# Patient Record
Sex: Male | Born: 1950 | Race: White | Hispanic: No | Marital: Married | Smoking: Former smoker
Health system: Southern US, Community
[De-identification: ages and names within clinical notes are randomized; demographics above are authoritative.]

## PROBLEM LIST (undated history)

## (undated) DIAGNOSIS — E785 Hyperlipidemia, unspecified: Secondary | ICD-10-CM

## (undated) DIAGNOSIS — H269 Unspecified cataract: Secondary | ICD-10-CM

## (undated) DIAGNOSIS — D649 Anemia, unspecified: Secondary | ICD-10-CM

## (undated) DIAGNOSIS — G709 Myoneural disorder, unspecified: Secondary | ICD-10-CM

## (undated) DIAGNOSIS — M1990 Unspecified osteoarthritis, unspecified site: Secondary | ICD-10-CM

## (undated) DIAGNOSIS — N189 Chronic kidney disease, unspecified: Secondary | ICD-10-CM

## (undated) DIAGNOSIS — K219 Gastro-esophageal reflux disease without esophagitis: Secondary | ICD-10-CM

## (undated) HISTORY — DX: Anemia, unspecified: D64.9

## (undated) HISTORY — PX: SPINE SURGERY: SHX786

## (undated) HISTORY — DX: Myoneural disorder, unspecified: G70.9

## (undated) HISTORY — DX: Gastro-esophageal reflux disease without esophagitis: K21.9

## (undated) HISTORY — PX: KIDNEY STONE SURGERY: SHX686

## (undated) HISTORY — DX: Hyperlipidemia, unspecified: E78.5

## (undated) HISTORY — DX: Unspecified osteoarthritis, unspecified site: M19.90

## (undated) HISTORY — DX: Unspecified cataract: H26.9

## (undated) HISTORY — PX: FRACTURE SURGERY: SHX138

## (undated) HISTORY — DX: Chronic kidney disease, unspecified: N18.9

---

## 1995-08-14 HISTORY — PX: BACK SURGERY: SHX140

## 2004-10-19 ENCOUNTER — Ambulatory Visit: Payer: Self-pay | Admitting: Internal Medicine

## 2004-11-01 ENCOUNTER — Ambulatory Visit: Payer: Self-pay | Admitting: Gastroenterology

## 2004-11-06 ENCOUNTER — Ambulatory Visit: Payer: Self-pay | Admitting: Internal Medicine

## 2004-11-13 ENCOUNTER — Ambulatory Visit: Payer: Self-pay | Admitting: Gastroenterology

## 2004-11-15 ENCOUNTER — Ambulatory Visit: Payer: Self-pay

## 2005-01-24 ENCOUNTER — Ambulatory Visit: Payer: Self-pay | Admitting: Internal Medicine

## 2005-03-16 ENCOUNTER — Ambulatory Visit: Payer: Self-pay | Admitting: Internal Medicine

## 2005-09-07 ENCOUNTER — Ambulatory Visit: Payer: Self-pay | Admitting: Internal Medicine

## 2005-10-12 ENCOUNTER — Ambulatory Visit: Payer: Self-pay | Admitting: Internal Medicine

## 2005-10-22 ENCOUNTER — Ambulatory Visit: Payer: Self-pay | Admitting: Internal Medicine

## 2008-05-24 ENCOUNTER — Ambulatory Visit: Payer: Self-pay | Admitting: Family Medicine

## 2008-05-24 DIAGNOSIS — E785 Hyperlipidemia, unspecified: Secondary | ICD-10-CM

## 2008-05-24 DIAGNOSIS — R3129 Other microscopic hematuria: Secondary | ICD-10-CM | POA: Insufficient documentation

## 2008-05-24 DIAGNOSIS — R3915 Urgency of urination: Secondary | ICD-10-CM | POA: Insufficient documentation

## 2008-05-24 LAB — CONVERTED CEMR LAB
Glucose, Urine, Semiquant: NEGATIVE
Nitrite: NEGATIVE
Protein, U semiquant: NEGATIVE
Specific Gravity, Urine: 1.015
Urobilinogen, UA: 0.2
WBC Urine, dipstick: NEGATIVE

## 2008-05-25 ENCOUNTER — Encounter: Payer: Self-pay | Admitting: Family Medicine

## 2008-05-25 LAB — CONVERTED CEMR LAB
Albumin: 4.9 g/dL (ref 3.5–5.2)
BUN: 13 mg/dL (ref 6–23)
CO2: 19 meq/L (ref 19–32)
Glucose, Bld: 98 mg/dL (ref 70–99)
PSA: 1.83 ng/mL (ref 0.10–4.00)
Sodium: 140 meq/L (ref 135–145)
Total Bilirubin: 0.3 mg/dL (ref 0.3–1.2)
Total Protein: 7.7 g/dL (ref 6.0–8.3)
WBC, UA: NONE SEEN cells/hpf (ref <3)

## 2008-05-28 ENCOUNTER — Telehealth: Payer: Self-pay | Admitting: Family Medicine

## 2009-10-27 ENCOUNTER — Encounter (INDEPENDENT_AMBULATORY_CARE_PROVIDER_SITE_OTHER): Payer: Self-pay | Admitting: *Deleted

## 2010-06-22 ENCOUNTER — Ambulatory Visit: Payer: Self-pay | Admitting: Family Medicine

## 2010-06-22 ENCOUNTER — Telehealth: Payer: Self-pay | Admitting: Family Medicine

## 2010-06-22 DIAGNOSIS — M5430 Sciatica, unspecified side: Secondary | ICD-10-CM

## 2010-06-27 ENCOUNTER — Encounter: Admission: RE | Admit: 2010-06-27 | Discharge: 2010-06-27 | Payer: Self-pay | Admitting: Family Medicine

## 2010-06-27 ENCOUNTER — Ambulatory Visit: Payer: Self-pay | Admitting: Family Medicine

## 2010-06-29 ENCOUNTER — Telehealth: Payer: Self-pay | Admitting: Family Medicine

## 2010-06-29 ENCOUNTER — Encounter (INDEPENDENT_AMBULATORY_CARE_PROVIDER_SITE_OTHER): Payer: Self-pay | Admitting: *Deleted

## 2010-07-01 ENCOUNTER — Encounter: Admission: RE | Admit: 2010-07-01 | Discharge: 2010-07-01 | Payer: Self-pay | Admitting: Family Medicine

## 2010-07-03 ENCOUNTER — Encounter
Admission: RE | Admit: 2010-07-03 | Discharge: 2010-08-02 | Payer: Self-pay | Source: Home / Self Care | Attending: Family Medicine | Admitting: Family Medicine

## 2010-07-04 ENCOUNTER — Encounter (INDEPENDENT_AMBULATORY_CARE_PROVIDER_SITE_OTHER): Payer: Self-pay | Admitting: *Deleted

## 2010-07-04 ENCOUNTER — Encounter: Payer: Self-pay | Admitting: Family Medicine

## 2010-07-13 ENCOUNTER — Encounter: Payer: Self-pay | Admitting: Family Medicine

## 2010-07-26 ENCOUNTER — Encounter: Payer: Self-pay | Admitting: Family Medicine

## 2010-09-12 NOTE — Letter (Signed)
Summary: Out of Work  [facility]  [address]   [place], [place] [address]   Phone: [phone]  Fax: [phone]    June 29, 2010   Employee:  DOCTOR SHEAHAN    To Whom It May Concern:   For Medical reasons, please excuse the above named employee from work for the following dates:  Start:    End:    If you need additional information, please feel free to contact our office.         Sincerely,    Nani Gasser, MD

## 2010-09-12 NOTE — Letter (Signed)
Summary: Out of Work  [facility]  [address], [address]   [place], [place] [address]   Phone: [phone]  Fax: [phone]    June 27, 2010   Employee:  NIXXON FARIA    To Whom It May Concern:   For Medical reasons, please excuse the above named employee from work for the following dates:  Start:   06-26-2010  . Seen today in the office and he is minimially better. We are getting an xray and treating with a burst of steroids. Next step will be determined by the xray.   If you need additional information, please feel free to contact our office.         Sincerely,    Nani Gasser MD

## 2010-09-12 NOTE — Assessment & Plan Note (Signed)
Summary: Sciatica   Vital Signs:  Patient profile:   60 year old male Height:      70 inches Weight:      170 pounds Pulse rate:   78 / minute BP sitting:   138 / 81  (right arm) Cuff size:   regular  Vitals Entered By: Avon Gully CMA, Duncan Dull) (June 22, 2010 10:41 AM) CC: left hip pain since sunday, pain radiates down to leg, leg has a tingling feeling and numb   Primary Care Provider:  Nani Gasser MD  CC:  left hip pain since sunday, pain radiates down to leg, and leg has a tingling feeling and numb.  History of Present Illness: left hip pain since sunday, pain radiates down to leg, leg has a tingling feeling and numb. 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 Slept with pilllow b/t his legs.  Injry was Monday or Tuesday night. Yesterday leg was very sore all the way down. to his foot. Tried walking in hopes it would "loosen up".  Got worse and finally went home early.  Last night took Tyelnol and Ibup. Couldn't get comfortable. Had happened years ago after jumping.  But was better after 3-4 days, years ago.  Hx of low back surgeyr about 25 years ago. Bulging disc around L5.  Pain starts where his wallet is and pain shots down to his ankle. Has numbness on the lateral ankle. he denies any back painwith this episode.  of note he has lost 19 pounds since I last saw him but he says he has been working on this and try to exercise regularly.  Current Medications (verified): 1)  Ranitidine Hcl 75 Mg Tabs (Ranitidine Hcl) .... Take One Tablet By Mouth Twice A Day  Allergies (verified): No Known Drug Allergies  Comments:  Nurse/Medical Assistant: The patient's medications and allergies were reviewed with the patient and were updated in the Medication and Allergy Lists. Avon Gully CMA, Duncan Dull) (June 22, 2010 10:42 AM)  Past History:  Past Surgical History: Kidney stones 1993 Back surgery, lumbar disc fusion?  1997  Physical Exam  General:   Well-developed,well-nourished,in no acute distress; alert,appropriate and cooperative throughout examination Msk:  Normal flexion. Acutally says this feels better. No extention. Normal side bending and rotation right and left. Nontender lumbar spine or SI joint.  VEry tender over the mid left buttock. Neg straight leg raise.  Pain with hip adduction and abduction with knee crosssed. hip knee and ankle strength 5/5.   Impression & Recommendations:  Problem # 1:  SCIATICA (ICD-724.3) Assessment New I suspect that he has piriformis his syndrome.  To consider this may still be coming from his back.  He did have a negative straight leg raise. in the meantime recommend ibuprofen t.i.d. with food.  Stop immediately if any stomach irritation.  Can also try a muscle relaxer.  He has found these helpful for his back in the past.  Also can use hydrocodone at that time only.  Warned about potential sedation effects.  If he is not improving significantly in two weeks and needs return to the office for further evaluation.  Also gave him a handout on exercises to do for performance syndrome.  If this is not helping consider referral for formal PT. His updated medication list for this problem includes:    Cyclobenzaprine Hcl 10 Mg Tabs (Cyclobenzaprine hcl) .Marland Kitchen... Take 1 tablet by mouth three times a day as needed muscle spasm.    Hydrocodone-acetaminophen 5-500 Mg Tabs (Hydrocodone-acetaminophen) .Marland KitchenMarland KitchenMarland KitchenMarland Kitchen  1-2 tabs by mouth at bedtime as needed severe pain.  Complete Medication List: 1)  Ranitidine Hcl 75 Mg Tabs (Ranitidine hcl) .... Take one tablet by mouth twice a day 2)  Cyclobenzaprine Hcl 10 Mg Tabs (Cyclobenzaprine hcl) .... Take 1 tablet by mouth three times a day as needed muscle spasm. 3)  Hydrocodone-acetaminophen 5-500 Mg Tabs (Hydrocodone-acetaminophen) .Marland Kitchen.. 1-2 tabs by mouth at bedtime as needed severe pain.  Patient Instructions: 1)  IBuprofen 600mg  three times a day with foods and water. Stop if  bothers your stomach. 2)  Flexeril as needed for muscle relaxer. Can cause sedationl 3)  Can use the hydrocodone at bedtime.  4)  Please schedule a follow-up appointment in 2 weeks if not completely resolved.  5)  Can start exercises tomorrow. .  Prescriptions: HYDROCODONE-ACETAMINOPHEN 5-500 MG TABS (HYDROCODONE-ACETAMINOPHEN) 1-2 tabs by mouth at bedtime as needed severe pain.  #20 x 0   Entered and Authorized by:   Nani Gasser MD   Signed by:   Nani Gasser MD on 06/22/2010   Method used:   Printed then faxed to ...       [facility]  [address] [phone]* (retail)       [address] [place], [place]  [address]       Ph: [phone] or [phone]       Fax: [phone]   RxID:   [id number] CYCLOBENZAPRINE HCL 10 MG TABS (CYCLOBENZAPRINE HCL) Take 1 tablet by mouth three times a day as needed muscle spasm.  #30 x 0   Entered and Authorized by:   Nani Gasser MD   Signed by:   Nani Gasser MD on 06/22/2010   Method used:   Printed then faxed to ...       [facility]  [address] [phone]* (retail)       [address] [place], [place]  [address]       Ph: [phone] or [phone]       Fax: [phone]   RxID:   [id number]    Orders Added: 1)  Est. Patient Level IV [42706]

## 2010-09-12 NOTE — Consult Note (Signed)
Summary: [facility]   Imported By: Maryln Gottron 07/21/2010 14:32:13  _____________________________________________________________________  External Attachment:    Type:   Image     Comment:   External Document

## 2010-09-12 NOTE — Progress Notes (Signed)
Summary: Dr. note for pt seen today  Phone Note Call from Patient   Caller: Patient Summary of Call: Pt. was seen today and forgot to get his Dr. Phoebe Sharps from [place], and will come by later today to pick up.... Initial call taken by: Michaelle Copas,  June 22, 2010 12:06 PM  Follow-up for Phone Call        ok pt given a note for the 9th thru 11th Follow-up by: Avon Gully CMA, Duncan Dull),  June 22, 2010 4:49 PM

## 2010-09-12 NOTE — Letter (Signed)
Summary: Colonoscopy Letter  [facility]  [address] [place], [place] [address]   Phone: [phone]  Fax: [phone]      October 27, 2009 MRN: [id number]   TAISHAUN LEVELS [address] [place], [place]  [address]   Dear Mr. MONESTIME,   According to your medical record, it is time for you to schedule a Colonoscopy. The American Cancer Society recommends this procedure as a method to detect early colon cancer. Patients with a family history of colon cancer, or a personal history of colon polyps or inflammatory bowel disease are at increased risk.  This letter has been generated based on the recommendations made at the time of your procedure. If you feel that in your particular situation this may no longer apply, please contact our office.  Please call our office at [phone] to schedule this appointment or to update your records at your earliest convenience.  Thank you for cooperating with [place] to provide you with the very best care possible.   Sincerely,  Barbette Hair. Arlyce Dice, M.D.  [facility] [phone]

## 2010-09-12 NOTE — Assessment & Plan Note (Signed)
Summary: F/U sciatica   Vital Signs:  Patient profile:   60 year old male Height:      70 inches Weight:      172 pounds Pulse rate:   86 / minute BP sitting:   112 / 71  (right arm) Cuff size:   regular  Vitals Entered By: Avon Gully CMA, Duncan Dull) (June 27, 2010 9:45 AM) CC: f/u leg pain, hip feels better but left lower leg still hurts   Primary Care Manolito Jurewicz:  Nani Gasser MD  CC:  f/u leg pain and hip feels better but left lower leg still hurts.  History of Present Illness: f/u leg pain, hip feels better but left lower leg still hurts.  Feels he is 25-30% better. INtially felt some better but then suddently worse at the end of last week.   If uses his IBU adn muscle relaxer gets about 3 hours of rest. Still getting up at night because of pain. Can walk for an extended amout of time. Pain still in the left buttock and some pain an dnumnbness on teh left knee to the ankle on the outside.    Current Medications (verified): 1)  Ranitidine Hcl 75 Mg Tabs (Ranitidine Hcl) .... Take One Tablet By Mouth Twice A Day 2)  Cyclobenzaprine Hcl 10 Mg Tabs (Cyclobenzaprine Hcl) .... Take 1 Tablet By Mouth Three Times A Day As Needed Muscle Spasm. 3)  Hydrocodone-Acetaminophen 5-500 Mg Tabs (Hydrocodone-Acetaminophen) .Marland Kitchen.. 1-2 Tabs By Mouth At Bedtime As Needed Severe Pain.  Allergies (verified): No Known Drug Allergies  Comments:  Nurse/Medical Assistant: The patient's medications and allergies were reviewed with the patient and were updated in the Medication and Allergy Lists. Avon Gully CMA, Duncan Dull) (June 27, 2010 9:46 AM)  Physical Exam  General:  Well-developed,well-nourished,in no acute distress; alert,appropriate and cooperative throughout examination Msk:  Low back flexion, rotatin adn side bending are normal and symmetric.  Dec extension. No lumbar back pain. Mild tenderness in the buttock area.  Neg straight leg raise. Hip, knee and ankle strength 5/5 .    Skin:  no rashes.     Impression & Recommendations:  Problem # 1:  SCIATICA (ICD-724.3) Will get xray. Depending on finding will likely refer for PT. He is out of work until he is able to improve secondary to his limited walking  ability.   His updated medication list for this problem includes:    Cyclobenzaprine Hcl 10 Mg Tabs (Cyclobenzaprine hcl) .Marland Kitchen... Take 1 tablet by mouth three times a day as needed muscle spasm.    Hydrocodone-acetaminophen 5-500 Mg Tabs (Hydrocodone-acetaminophen) .Marland Kitchen... 1-2 tabs by mouth at bedtime as needed severe pain.  Orders: T-DG Lumbar Spine 2-3 Views (72100)  Complete Medication List: 1)  Ranitidine Hcl 75 Mg Tabs (Ranitidine hcl) .... Take one tablet by mouth twice a day 2)  Cyclobenzaprine Hcl 10 Mg Tabs (Cyclobenzaprine hcl) .... Take 1 tablet by mouth three times a day as needed muscle spasm. 3)  Hydrocodone-acetaminophen 5-500 Mg Tabs (Hydrocodone-acetaminophen) .Marland Kitchen.. 1-2 tabs by mouth at bedtime as needed severe pain. 4)  Prednisone 20 Mg Tabs (Prednisone) .... 2 tabs by mouth daily for r one week.  Patient Instructions: 1)  We will call you with the xray results.  Prescriptions: PREDNISONE 20 MG TABS (PREDNISONE) 2 tabs by mouth daily for r one week.  #10 x 0   Entered and Authorized by:   Nani Gasser MD   Signed by:   Nani Gasser MD on 06/27/2010  Method used:   [facility] [phone][facility] (retail)       [address]       [place], [place]  [address]       Ph: [phone] or [phone]       Fax: [phone]   RxID:   [id number]    Orders Added: 1)  T-DG Lumbar Spine 2-3 Views [72100] 2)  Est. Patient Level III [36644]

## 2010-09-12 NOTE — Letter (Signed)
Summary: Primary Care Consult Scheduled Letter  [facility]  [address]   [place], [place] [address]   Phone: [phone]  Fax: [phone]      07/04/2010 MRN: [id number]  Dennis Waller [address] [place], [place]  [address]    Dear Mr. Dennis Waller,     We have scheduled an appointment for you.  At the recommendation of Dr.Metheney, we have scheduled you a consult with Dr.Duda on Thurday 07/13/10 at 8:00.  Their located in the same buiding as Dr. Linford Arnold but they are in [address]. The office phone number is [phone].  If this appointment day and time is not convenient for you, please feel free to call the office of the doctor you are being referred to at the number listed above and reschedule the appointment.     It is important for you to keep your scheduled appointments. We are here to make sure you are given good patient care.   Thank you, Michaelle Copas [phone] Patient Care Coordinator [facility] Kathryne Sharper

## 2010-09-12 NOTE — Progress Notes (Signed)
Summary: Return to work  Phone Note Call from Patient Call back at [employer] [phone]   Caller: Patient Call For: Nani Gasser MD Summary of Call: Pt calls and wants to know if he can a letter to return to work Initial call taken by: Kathlene November LPN,  June 29, 2010 2:19 PM  Follow-up for Phone Call        YES, OK to return to work.  Follow-up by: Nani Gasser MD,  June 29, 2010 2:31 PM  Additional Follow-up for Phone Call Additional follow up Details #1::        pt notified Additional Follow-up by: Avon Gully CMA, Duncan Dull),  June 29, 2010 4:00 PM

## 2010-09-12 NOTE — Miscellaneous (Signed)
Summary: Initial Summary for PT Services/[facility]  Initial Summary for PT Services/[facility]   Imported By: Maryln Gottron 07/17/2010 14:58:32  _____________________________________________________________________  External Attachment:    Type:   Image     Comment:   External Document

## 2010-09-14 NOTE — Miscellaneous (Signed)
Summary: PT Discharge/[facility]  PT Discharge/[facility]   Imported By: Lanelle Bal 08/21/2010 10:30:13  _____________________________________________________________________  External Attachment:    Type:   Image     Comment:   External Document

## 2011-02-28 ENCOUNTER — Telehealth: Payer: Self-pay | Admitting: *Deleted

## 2011-02-28 NOTE — Telephone Encounter (Signed)
Pt does not want to schedule colonoscopy, he stated he is aware he is past due but has family members in the hospital will call when he is ready....    I dont think by his tone he wants to be bothered with another reminder about his colonoscopy.

## 2011-03-08 ENCOUNTER — Encounter: Payer: Self-pay | Admitting: Family Medicine

## 2011-03-09 ENCOUNTER — Ambulatory Visit (INDEPENDENT_AMBULATORY_CARE_PROVIDER_SITE_OTHER): Payer: BC Managed Care – PPO | Admitting: Family Medicine

## 2011-03-09 ENCOUNTER — Encounter: Payer: Self-pay | Admitting: Family Medicine

## 2011-03-09 VITALS — BP 130/76 | HR 69 | Ht 70.0 in | Wt 174.0 lb

## 2011-03-09 DIAGNOSIS — Z23 Encounter for immunization: Secondary | ICD-10-CM

## 2011-03-09 DIAGNOSIS — Z2911 Encounter for prophylactic immunotherapy for respiratory syncytial virus (RSV): Secondary | ICD-10-CM

## 2011-03-09 DIAGNOSIS — E785 Hyperlipidemia, unspecified: Secondary | ICD-10-CM

## 2011-03-09 MED ORDER — ROSUVASTATIN CALCIUM 10 MG PO TABS: 10.0000 mg | ORAL_TABLET | Freq: Every day | ORAL | Status: DC

## 2011-03-09 MED ORDER — OMEGA-3 FATTY ACIDS 1000 MG PO CAPS: 4.0000 g | ORAL_CAPSULE | Freq: Every day | ORAL | Status: AC

## 2011-03-09 MED ORDER — TETANUS-DIPHTH-ACELL PERTUSSIS 5-2-15.5 LF-MCG/0.5 IM SUSP: 0.5000 mL | Freq: Once | INTRAMUSCULAR | Status: DC

## 2011-03-09 MED ORDER — ASPIRIN EC 81 MG PO TBEC: 81.0000 mg | DELAYED_RELEASE_TABLET | Freq: Every day | ORAL | Status: AC

## 2011-03-09 NOTE — Assessment & Plan Note (Addendum)
Reviewed his results. Recommend restart his Fish oil for his TG.  Keep up the exercise. Recommend restart a statin.  Recheck labs in 8 weeks once starts the statin.

## 2011-03-09 NOTE — Patient Instructions (Addendum)
Call in 2 months and we will recheck your lipids to see how well the cholesterol med is working Do restart your baby Aspirin and fish oil.

## 2011-03-09 NOTE — Progress Notes (Signed)
  Subjective:    Patient ID: Dennis Waller, male    DOB: [birth year], 60 y.o.   MRN: [id number]  HPI Hyperlipidemia - Used to be on vytorin about 5 years ago. When ran out never took it again. 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 Brought in copy of labs today to review.  He decided he want to get back on track.  Still takes ranitidine bid for reflux. Tolerates really well. He has lost a lot of weight intentionally.  He is very active and gets regular exercise. Uses tylenol 4 grams and  Advill 1200mg  for pain relief with his joint pain.  Did PT last year and that was helpful.  Still does his exercises prn. Quit taking his baby ASA.  Used to on Fish oil for his TG     Review of Systems     Objective:   Physical Exam  Constitutional: He is oriented to person, place, and time. He appears well-developed and well-nourished.  HENT:  Head: Normocephalic and atraumatic.  Cardiovascular: Normal rate and regular rhythm.   Pulmonary/Chest: Effort normal and breath sounds normal.  Musculoskeletal: He exhibits no edema.  Neurological: He is alert and oriented to person, place, and time.  Skin: Skin is warm and dry.  Psychiatric: He has a normal mood and affect. His behavior is normal.          Assessment & Plan:   I do recommend restarting his baby ASA as well.   Dsicussed need for vcaccination for Tdap and Shingles.  He agreed to get those today.

## 2011-03-12 ENCOUNTER — Telehealth: Payer: Self-pay | Admitting: *Deleted

## 2011-03-12 DIAGNOSIS — Z1211 Encounter for screening for malignant neoplasm of colon: Secondary | ICD-10-CM

## 2011-03-12 NOTE — Telephone Encounter (Signed)
Pt's wife called and states he needs a referral to GI because he needs a colonoscopy. Trying to get this scheduled late sept

## 2011-03-12 NOTE — Telephone Encounter (Signed)
Order entered

## 2011-03-19 ENCOUNTER — Encounter: Payer: Self-pay | Admitting: Family Medicine

## 2011-04-23 LAB — HM COLONOSCOPY

## 2011-05-07 ENCOUNTER — Other Ambulatory Visit: Payer: Self-pay | Admitting: Family Medicine

## 2011-05-09 ENCOUNTER — Encounter: Payer: Self-pay | Admitting: Family Medicine

## 2011-07-05 ENCOUNTER — Other Ambulatory Visit: Payer: Self-pay | Admitting: Family Medicine

## 2011-09-13 ENCOUNTER — Other Ambulatory Visit: Payer: Self-pay | Admitting: Family Medicine

## 2012-02-18 ENCOUNTER — Ambulatory Visit (INDEPENDENT_AMBULATORY_CARE_PROVIDER_SITE_OTHER): Payer: Managed Care, Other (non HMO) | Admitting: Family Medicine

## 2012-02-18 ENCOUNTER — Encounter: Payer: Self-pay | Admitting: Family Medicine

## 2012-02-18 VITALS — BP 119/79 | HR 69 | Ht 70.0 in | Wt 182.0 lb

## 2012-02-18 DIAGNOSIS — R252 Cramp and spasm: Secondary | ICD-10-CM

## 2012-02-18 DIAGNOSIS — E785 Hyperlipidemia, unspecified: Secondary | ICD-10-CM

## 2012-02-18 DIAGNOSIS — K219 Gastro-esophageal reflux disease without esophagitis: Secondary | ICD-10-CM

## 2012-02-18 DIAGNOSIS — H269 Unspecified cataract: Secondary | ICD-10-CM

## 2012-02-18 DIAGNOSIS — M1990 Unspecified osteoarthritis, unspecified site: Secondary | ICD-10-CM

## 2012-02-18 NOTE — Patient Instructions (Addendum)
Work on staying hydrated and do gentle stretches for leg cramps.

## 2012-02-18 NOTE — Progress Notes (Signed)
  Subjective:    Patient ID: Dennis Waller, male    DOB: 1950-11-27, 61 y.o.   MRN: [id number]  HPI Hyperlipidemia  - doing well. Ran out of med about 1.5 months ago. Says he feels well.  No CP or SOB.   OA- Says going once a month to get a massage and says this has really helped.    Saw optometrist recently and got new lenses, but has starting to develop a cataract in the right eye.  Also dx with dry eye and using OTC artificial tears and really helps.     Review of Systems     Objective:   Physical Exam  Constitutional: He is oriented to person, place, and time. He appears well-developed and well-nourished.  HENT:  Head: Normocephalic and atraumatic.  Neck: Neck supple. No thyromegaly present.  Cardiovascular: Normal rate, regular rhythm and normal heart sounds.        No carotid bruits.   Pulmonary/Chest: Effort normal and breath sounds normal.  Lymphadenopathy:    He has no cervical adenopathy.  Neurological: He is alert and oriented to person, place, and time.  Skin: Skin is warm and dry.  Psychiatric: He has a normal mood and affect. His behavior is normal.          Assessment & Plan:  Hyperlipidemia - Work on Engineer, building services exercise. Restart med and recheck chol levels in 2-4 weeks. Will go for labs today.    OA- Can use tyelnol prn. Continue massage prn. Will use Advil occ.   Foot cramps -= Says always happens at night.  Says better when hydrates really well and replace electrolytes. Work on International aid/development worker.

## 2012-02-19 ENCOUNTER — Other Ambulatory Visit: Payer: Self-pay | Admitting: Family Medicine

## 2012-03-06 LAB — COMPLETE METABOLIC PANEL WITH GFR
Albumin: 4.4 g/dL (ref 3.5–5.2)
CO2: 28 mEq/L (ref 19–32)
Calcium: 8.9 mg/dL (ref 8.4–10.5)
Chloride: 105 mEq/L (ref 96–112)
GFR, Est African American: >89 mL/min
GFR, Est Non African American: 88 mL/min
Glucose, Bld: 92 mg/dL (ref 70–99)
Sodium: 141 mEq/L (ref 135–145)
Total Bilirubin: 0.4 mg/dL (ref 0.3–1.2)
Total Protein: 6.5 g/dL (ref 6.0–8.3)

## 2012-03-06 LAB — LIPID PANEL
HDL: 36 mg/dL — ABNORMAL LOW (ref >39)
LDL Cholesterol: 82 mg/dL (ref 0–99)

## 2012-04-15 ENCOUNTER — Other Ambulatory Visit: Payer: Self-pay | Admitting: Family Medicine

## 2012-06-19 ENCOUNTER — Other Ambulatory Visit: Payer: Self-pay | Admitting: Family Medicine

## 2012-06-26 ENCOUNTER — Other Ambulatory Visit: Payer: Self-pay | Admitting: Family Medicine

## 2012-09-29 ENCOUNTER — Other Ambulatory Visit: Payer: Self-pay | Admitting: Family Medicine

## 2012-12-03 ENCOUNTER — Other Ambulatory Visit: Payer: Self-pay | Admitting: Family Medicine

## 2012-12-03 NOTE — Telephone Encounter (Signed)
Needs appoinment

## 2013-02-12 ENCOUNTER — Ambulatory Visit (INDEPENDENT_AMBULATORY_CARE_PROVIDER_SITE_OTHER): Payer: Managed Care, Other (non HMO) | Admitting: Family Medicine

## 2013-02-12 ENCOUNTER — Encounter: Payer: Self-pay | Admitting: Family Medicine

## 2013-02-12 VITALS — BP 102/80 | HR 75 | Ht 70.0 in | Wt 185.0 lb

## 2013-02-12 DIAGNOSIS — Z0000 Encounter for general adult medical examination without abnormal findings: Secondary | ICD-10-CM

## 2013-02-12 DIAGNOSIS — Z720 Tobacco use: Secondary | ICD-10-CM

## 2013-02-12 DIAGNOSIS — R200 Anesthesia of skin: Secondary | ICD-10-CM

## 2013-02-12 DIAGNOSIS — R209 Unspecified disturbances of skin sensation: Secondary | ICD-10-CM

## 2013-02-12 DIAGNOSIS — F17200 Nicotine dependence, unspecified, uncomplicated: Secondary | ICD-10-CM

## 2013-02-12 DIAGNOSIS — E785 Hyperlipidemia, unspecified: Secondary | ICD-10-CM

## 2013-02-12 LAB — COMPLETE METABOLIC PANEL WITH GFR
ALT: 13 U/L (ref 0–53)
Albumin: 4.6 g/dL (ref 3.5–5.2)
CO2: 27 mEq/L (ref 19–32)
Calcium: 9 mg/dL (ref 8.4–10.5)
Chloride: 105 mEq/L (ref 96–112)
GFR, Est African American: >89 mL/min
GFR, Est Non African American: >89 mL/min
Glucose, Bld: 86 mg/dL (ref 70–99)
Potassium: 4.1 mEq/L (ref 3.5–5.3)
Sodium: 142 mEq/L (ref 135–145)
Total Protein: 6.5 g/dL (ref 6.0–8.3)

## 2013-02-12 LAB — CBC WITH DIFFERENTIAL/PLATELET
Basophils Absolute: 0 10*3/uL (ref 0.0–0.1)
Basophils Relative: 0 % (ref 0–1)
Eosinophils Relative: 4 % (ref 0–5)
HCT: 35.4 % — ABNORMAL LOW (ref 39.0–52.0)
Lymphocytes Relative: 30 % (ref 12–46)
MCHC: 35 g/dL (ref 30.0–36.0)
Monocytes Absolute: 0.7 10*3/uL (ref 0.1–1.0)
Neutro Abs: 3.5 10*3/uL (ref 1.7–7.7)
Platelets: 267 10*3/uL (ref 150–400)
RDW: 13.5 % (ref 11.5–15.5)
WBC: 6.4 10*3/uL (ref 4.0–10.5)

## 2013-02-12 LAB — FERRITIN: Ferritin: 122 ng/mL (ref 22–322)

## 2013-02-12 LAB — VITAMIN B12: Vitamin B-12: 269 pg/mL (ref 211–911)

## 2013-02-12 LAB — HEMOGLOBIN A1C: Hgb A1c MFr Bld: 5.5 % (ref <5.7)

## 2013-02-12 LAB — LIPID PANEL: LDL Cholesterol: 72 mg/dL (ref 0–99)

## 2013-02-12 NOTE — Progress Notes (Signed)
Subjective:    Patient ID: Dennis Waller, male    DOB: [birth year], 62 y.o.   MRN: [id number]  HPI Here for CPE today. Plans on retiring in October. Says his job is really stressful and is very physically demandin.  Does have some tingling in his toes.    Review of Systems Comprehensive ROS neg  BP 140/80  Pulse 75  Ht 5\' 10"  (1.778 m)  Wt 185 lb (83.915 kg)  BMI 26.54 kg/m2    No Known Allergies  Past Medical History  Diagnosis Date  . Hyperlipidemia   . Chronic kidney disease     kidney stones    Past Surgical History  Procedure Laterality Date  . Kidney stone surgery    . Back surgery  1997    ? lumbar disc fusion    History   Social History  . Marital Status: Married    Spouse Name: diane    Number of Children: N/A  . Years of Education: N/A   Occupational History  .     Social History Main Topics  . Smoking status: Current Every Day Smoker -- 1.00 packs/day    Types: Cigarettes  . Smokeless tobacco: Not on file  . Alcohol Use: Yes  . Drug Use: No  . Sexually Active: Not on file   Other Topics Concern  . 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 Diabetes Mother   . Hypertension Mother   . Cancer Mother     colon  . COPD Mother   . Hyperlipidemia Father   . Hypertension Father     Outpatient Encounter Prescriptions as of 02/12/2013  Medication Sig Dispense Refill  . CRESTOR 10 MG tablet TAKE 1 TABLET BY MOUTH AT BEDTIME.  30 tablet  1  . ranitidine (ZANTAC) 75 MG tablet Take 75 mg by mouth 2 (two) times daily.        . [DISCONTINUED] CRESTOR 10 MG tablet TAKE 1 TABLET BY MOUTH AT BEDTIME.  30 tablet  0   No facility-administered encounter medications on file as of 02/12/2013.          Objective:   Physical Exam  Constitutional: He is oriented to person, place, and time. He appears well-developed and well-nourished.  HENT:  Head: Normocephalic and  atraumatic.  Right Ear: External ear normal.  Left Ear: External ear normal.  Nose: Nose normal.  Mouth/Throat: Oropharynx is clear and moist.  Eyes: Conjunctivae and EOM are normal. Pupils are equal, round, and reactive to light.  Neck: Normal range of motion. Neck supple. No thyromegaly present.  Cardiovascular: Normal rate, regular rhythm, normal heart sounds and intact distal pulses.   No carotid bruits.   Pulmonary/Chest: Effort normal and breath sounds normal.  Abdominal: Soft. Bowel sounds are normal. He exhibits no distension and no mass. There is no tenderness. There is no rebound and no guarding.  Musculoskeletal: Normal range of motion. He exhibits no edema.  Lymphadenopathy:    He has no cervical adenopathy.  Neurological: He is alert and oriented to person, place, and time. He has normal reflexes.  Skin: Skin is warm and dry.  Psychiatric: He has a normal mood and affect. His behavior is normal. Judgment and thought content normal.          Assessment & Plan:  CPE Keep up a regular exercise program and make sure you are  eating a healthy diet Try to eat 4 servings of dairy a day, or if you are lactose intolerant take a calcium with vitamin D daily.  Your vaccines are up to date.  BAseline EKG today.  EKG shows rate of 62 beats per minute, normal sinus rhythm with normal axis. No acute changes. Tob abuse - encoiuraged cessation. H.O given.  BP is slightly elevated today, unusual for him.

## 2013-02-12 NOTE — Patient Instructions (Signed)

## 2013-02-17 ENCOUNTER — Other Ambulatory Visit: Payer: Self-pay | Admitting: *Deleted

## 2013-02-17 ENCOUNTER — Encounter: Payer: Self-pay | Admitting: *Deleted

## 2013-02-17 MED ORDER — SILDENAFIL CITRATE 50 MG PO TABS: 50.0000 mg | ORAL_TABLET | Freq: Every day | ORAL | Status: DC | PRN

## 2013-02-17 MED ORDER — ROSUVASTATIN CALCIUM 10 MG PO TABS: ORAL_TABLET | ORAL | Status: DC

## 2013-02-17 NOTE — Progress Notes (Signed)
rx sent

## 2013-02-17 NOTE — Progress Notes (Signed)
Pt would like an Rx for Viagra. Stated that he has taken this in the past it was prescribed by another dr.. Please advise.Dennis Waller

## 2013-07-02 ENCOUNTER — Ambulatory Visit (INDEPENDENT_AMBULATORY_CARE_PROVIDER_SITE_OTHER): Payer: Managed Care, Other (non HMO) | Admitting: Family Medicine

## 2013-07-02 ENCOUNTER — Encounter: Payer: Self-pay | Admitting: Family Medicine

## 2013-07-02 VITALS — BP 139/78 | HR 73 | Wt 184.0 lb

## 2013-07-02 DIAGNOSIS — M5432 Sciatica, left side: Secondary | ICD-10-CM

## 2013-07-02 DIAGNOSIS — M5430 Sciatica, unspecified side: Secondary | ICD-10-CM

## 2013-07-02 DIAGNOSIS — Z23 Encounter for immunization: Secondary | ICD-10-CM

## 2013-07-02 MED ORDER — TRAMADOL HCL 50 MG PO TABS: 50.0000 mg | ORAL_TABLET | Freq: Three times a day (TID) | ORAL | Status: DC | PRN

## 2013-07-02 MED ORDER — PREDNISONE 50 MG PO TABS: 50.0000 mg | ORAL_TABLET | Freq: Every day | ORAL | Status: DC

## 2013-07-02 NOTE — Progress Notes (Signed)
  Subjective:    Patient ID: Dennis Waller, male    DOB: [birth year], 62 y.o.   MRN: [id number]  HPI Had a root canal in August that didn't take and then had to have an extraction in september.  Had a huge abscess.  Days didn't feel well during that time as well.  Dennis Waller retired Oct 1st.  Joined the gym and hip became sore on the left.  Has been worse since did a lie dancing class and now has pain on outer lower leg from knee ot ankle. Started in the left buttock area.  Taking Advil and Tylenol and using ice and heat for temporary relief.  Has been using pillow between the knees and helps.     Review of Systems     Objective:   Physical Exam  Constitutional: Dennis Waller is oriented to person, place, and time. Dennis Waller appears well-developed and well-nourished.  HENT:  Head: Normocephalic and atraumatic.  Musculoskeletal:  Lumbar spine with normal flexion, extension, rotation right and left, side bending. Dennis Waller is mildly tender in the lower lumbar spine. No SI joint tenderness. A little bit tender over the mid left buttock area. No significant pain with internal or external rotation of the hip. Normal flexion and extension of the hip as well. Hip, knee, ankle strength bilaterally is 5 over 5. Patellar reflexes 1+ bilaterally. Negative straight leg raise bilaterally.  Neurological: Dennis Waller is alert and oriented to person, place, and time.  Skin: Skin is warm and dry.  Psychiatric: Dennis Waller has a normal mood and affect. His behavior is normal.          Assessment & Plan:  Left sciatica - it started in doing conservative therapy with home stretches. Dennis Waller actually has done physical therapy in the past for piriformis syndrome. Dennis Waller said Dennis Waller remember a lot of the exercises and started doing them. Continue heat and ice. Stop the anti-inflammatory and the prednisone for 5 days. After that can restart the Indocin for his knee. Can use Tylenol and given tramadol for when necessary use. 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 If not improving may consider further evaluation with imaging and possible referral back to physical therapy.

## 2013-07-21 ENCOUNTER — Ambulatory Visit (INDEPENDENT_AMBULATORY_CARE_PROVIDER_SITE_OTHER): Payer: Managed Care, Other (non HMO) | Admitting: Family Medicine

## 2013-07-21 ENCOUNTER — Encounter: Payer: Self-pay | Admitting: Family Medicine

## 2013-07-21 VITALS — BP 150/89 | HR 71 | Temp 97.7°F | Ht 69.0 in | Wt 187.0 lb

## 2013-07-21 DIAGNOSIS — M5430 Sciatica, unspecified side: Secondary | ICD-10-CM

## 2013-07-21 DIAGNOSIS — S60459A Superficial foreign body of unspecified finger, initial encounter: Secondary | ICD-10-CM

## 2013-07-21 DIAGNOSIS — M5432 Sciatica, left side: Secondary | ICD-10-CM

## 2013-07-21 NOTE — Progress Notes (Signed)
   Subjective:    Patient ID: Dennis Waller, male    DOB: 02-May-1951, 62 y.o.   MRN: [id number]  HPI F/u left sciatica. Overall much better. Did go back to the gym yesterday. Got on bike for 2 miles and walked 1 mile and a little sore in the muscle today.  Says the prednisone really helped and had to conintue the tramadol for a few more days.  Does ues tylenol occ for knees and hips.    He would like me to look at his left thumb today. He was moving his wife this plan and got a few of the door and caught his thumb. His wife had tried to get them at all. The areas just sore and irritated and he would like me to look at today. No fever or heat or drainage from the wound. Review of Systems     Objective:   Physical Exam  Constitutional: He is oriented to person, place, and time. He appears well-developed and well-nourished.  Musculoskeletal:  Normal lumbar flexion, extension, rotation right and left an side bending.  Neg straight raise. Patellar reflex 0+ bilat.  Hip, knee and ankle strength is 5/5.   Neurological: He is alert and oriented to person, place, and time.  Skin: Skin is warm and dry.  Psychiatric: He has a normal mood and affect. His behavior is normal.   He does have a dried thick callused area on the pad of the film. There is an area that looks like it may be either a small scab or possibly a small thorn.        Assessment & Plan:  Left sciatica - much improved. Make sure ta gradually work back into workout routine.  He says he still has a few terminal left to use as needed. Can use Tylenol again on a regular basis.  Foreign body in thumb-difficult to tell if this is just a scab on actual thorn.  I encouraged him to keep an eye on the area. If it starts to turn red or if in or starts to drain then please call me back we'll put him on oral antibiotics. I think it's just irritated and is trying to heal.

## 2013-09-20 ENCOUNTER — Other Ambulatory Visit: Payer: Self-pay | Admitting: Family Medicine

## 2013-09-28 ENCOUNTER — Telehealth: Payer: Self-pay | Admitting: Family Medicine

## 2013-09-28 MED ORDER — ROSUVASTATIN CALCIUM 10 MG PO TABS: ORAL_TABLET | ORAL | Status: DC

## 2013-09-28 NOTE — Telephone Encounter (Signed)
[facility]or 10 mg.  This is a new ins for him so [facility] has [facility].

## 2013-09-28 NOTE — Telephone Encounter (Signed)
rx sent.Venda Dice Lynetta  

## 2014-06-10 ENCOUNTER — Encounter: Payer: Managed Care, Other (non HMO) | Admitting: Family Medicine

## 2014-06-11 ENCOUNTER — Encounter: Payer: Self-pay | Admitting: Family Medicine

## 2014-06-11 ENCOUNTER — Ambulatory Visit (INDEPENDENT_AMBULATORY_CARE_PROVIDER_SITE_OTHER): Payer: Medicare Other | Admitting: Family Medicine

## 2014-06-11 VITALS — BP 141/86 | HR 79 | Temp 99.1°F | Ht 69.0 in | Wt 185.0 lb

## 2014-06-11 DIAGNOSIS — Z23 Encounter for immunization: Secondary | ICD-10-CM

## 2014-06-11 DIAGNOSIS — Z0000 Encounter for general adult medical examination without abnormal findings: Secondary | ICD-10-CM

## 2014-06-11 DIAGNOSIS — E785 Hyperlipidemia, unspecified: Secondary | ICD-10-CM

## 2014-06-11 MED ORDER — ATORVASTATIN CALCIUM 40 MG PO TABS: 40.0000 mg | ORAL_TABLET | Freq: Every day | ORAL | Status: DC

## 2014-06-11 NOTE — Addendum Note (Signed)
Addended by: Teddy Spike on: 06/11/2014 02:55 PM   Modules accepted: Orders

## 2014-06-11 NOTE — Progress Notes (Signed)
Subjective:    Dennis Waller is a 63 y.o. male who presents for Medicare Annual/Subsequent preventive examination.   Preventive Screening-Counseling & Management  Tobacco History  Smoking status  . Current Every Day Smoker -- 1.00 packs/day  . Types: Cigarettes  Smokeless tobacco  . Not on file    Problems Prior to Visit 1. None.  He will need to change his Crestor as will be very expensive on the new medicare plan.   Current Problems (verified) Patient Active Problem List   Diagnosis Date Noted  . Cataract 02/18/2012  . GERD (gastroesophageal reflux disease) 02/18/2012  . SCIATICA 06/22/2010  . HYPERLIPIDEMIA 05/24/2008  . MICROSCOPIC HEMATURIA 05/24/2008  . URINARY URGENCY 05/24/2008    Medications Prior to Visit Current Outpatient Prescriptions on File Prior to Visit  Medication Sig Dispense Refill  . ranitidine (ZANTAC) 75 MG tablet Take 75 mg by mouth 2 (two) times daily.        . sildenafil (VIAGRA) 50 MG tablet Take 1 tablet (50 mg total) by mouth daily as needed for erectile dysfunction.  10 tablet  6   No current facility-administered medications on file prior to visit.    Current Medications (verified) Current Outpatient Prescriptions  Medication Sig Dispense Refill  . Multiple Vitamins-Minerals (OCUVITE ADULT 50+ PO) Take 1 tablet by mouth daily.      . ranitidine (ZANTAC) 75 MG tablet Take 75 mg by mouth 2 (two) times daily.        . sildenafil (VIAGRA) 50 MG tablet Take 1 tablet (50 mg total) by mouth daily as needed for erectile dysfunction.  10 tablet  6  . atorvastatin (LIPITOR) 40 MG tablet Take 1 tablet (40 mg total) by mouth daily.  90 tablet  3   No current facility-administered medications for this visit.     Allergies (verified) Review of patient's allergies indicates no known allergies.   PAST HISTORY  Family History Family History  Problem Relation Age of Onset  . Diabetes Mother   . Hypertension Mother   . Cancer Mother    colon  . COPD Mother   . Hyperlipidemia Father   . Hypertension Father     Social History History  Substance Use Topics  . Smoking status: Current Every Day Smoker -- 1.00 packs/day    Types: Cigarettes  . Smokeless tobacco: Not on file  . Alcohol Use: Yes    Are there smokers in your home (other than you)?  No  Risk Factors Current exercise habits: The patient does not participate in regular exercise at present.  Dietary issues discussed: None   Cardiac risk factors: advanced age (older than 22 for men, 74 for women), dyslipidemia and smoking/ tobacco exposure.  Depression Screen (Note: if answer to either of the following is "Yes", a more complete depression screening is indicated)   Q1: Over the past two weeks, have you felt down, depressed or hopeless? No  Q2: Over the past two weeks, have you felt little interest or pleasure in doing things? No  Have you lost interest or pleasure in daily life? No  Do you often feel hopeless? No  Do you cry easily over simple problems? No  Activities of Daily Living In your present state of health, do you have any difficulty performing the following activities?:  Driving? No Managing money?  No Feeding yourself? No Getting from bed to chair? No Climbing a flight of stairs? No Preparing food and eating?: No Bathing or showering? No Getting dressed:  No Getting to the toilet? No Using the toilet:No Moving around from place to place: No In the past year have you fallen or had a near fall?:No   Are you sexually active?  Yes  Do you have more than one partner?  No  Hearing Difficulties: No Do you often ask people to speak up or repeat themselves? No Do you experience ringing or noises in your ears? No Do you have difficulty understanding soft or whispered voices? Yes   Do you feel that you have a problem with memory? No  Do you often misplace items? No  Do you feel safe at home?  Yes  Cognitive Testing  Alert? Yes  Normal  Appearance?Yes  Oriented to person? Yes  Place? Yes   Time? Yes  Recall of three objects?  Yes  Can perform simple calculations? Yes  Displays appropriate judgment?Yes  Can read the correct time from a watch face?Yes   Advanced Directives have been discussed with the patient? Yes   List the Names of Other Physician/Practitioners you currently use: 1.  [facility] - Dr. August Luz  Indicate any recent Medical Services you may have received from other than [facility] providers in the past year (date may be approximate).  Immunization History  Administered Date(s) Administered  . Influenza,inj,Quad PF,36+ Mos 07/02/2013  . Pneumococcal Polysaccharide-23 08/13/2004  . Tdap 03/09/2011  . Zoster 03/09/2011    Screening Tests Health Maintenance  Topic Date Due  . Influenza Vaccine  03/13/2014  . Pneumococcal Polysaccharide Vaccine Age 42 And Over  05/12/2014  . Tetanus/tdap  03/08/2021  . Colonoscopy  04/22/2021  . Zostavax  Completed    All answers were reviewed with the patient and necessary referrals were made:  METHENEY,CATHERINE, MD   06/11/2014   History reviewed: allergies, current medications, past family history, past medical history, past social history, past surgical history and problem list  Review of Systems A comprehensive review of systems was negative.    Objective:     Vision by Snellen chart: right eye:20/30, left eye:20/25 Blood pressure 141/86, pulse 79, temperature 99.1 F (37.3 C), height 5\' 9"  (1.753 m), weight 185 lb (83.915 kg). Body mass index is 27.31 kg/(m^2).  BP 141/86  Pulse 79  Temp(Src) 99.1 F (37.3 C)  Ht 5\' 9"  (1.753 m)  Wt 185 lb (83.915 kg)  BMI 27.31 kg/m2  General Appearance:    Alert, cooperative, no distress, appears stated age  Head:    Normocephalic, without obvious abnormality, atraumatic  Eyes:    PERRL, conjunctiva/corneas clear, EOM's intact,both eyes       Ears:    Normal TM's and external ear canals, both ears  Nose:    Nares normal, septum midline, mucosa normal, no drainage    or sinus tenderness  Throat:   Lips, mucosa, and tongue normal; teeth and gums normal  Neck:   Supple, symmetrical, trachea midline, no adenopathy;       thyroid:  No enlargement/tenderness/nodules; no carotid   bruit or JVD  Back:     Symmetric, no curvature, ROM normal, no CVA tenderness  Lungs:     Clear to auscultation bilaterally, respirations unlabored  Chest wall:    No tenderness or deformity  Heart:    Regular rate and rhythm, S1 and S2 normal, no murmur, rub   or gallop  Abdomen:     Soft, non-tender, bowel sounds active all four quadrants,    no masses, no organomegaly  Genitalia:    Not performed  Rectal:    Not performed  Extremities:   Extremities normal, atraumatic, no cyanosis or edema  Pulses:   2+ and symmetric all extremities  Skin:   Skin color, texture, turgor normal, no rashes or lesions  Lymph nodes:   Cervical, supraclavicular, and axillary nodes normal  Neurologic:   CNII-XII intact. Normal strength, sensation and reflexes      throughout       Assessment:     Welcome to Medicare Exam        Plan:     During the course of the visit the patient was educated and counseled about appropriate screening and preventive services including:    Influenza vaccine  Hyperlipidemia-he will need to switch off of Lipitor now he is on Medicare. We'll change to atorvastatin. We'll need to recheck lipids in 6-8 weeks.  Prenvar 13 today, will update 23 in one year.   EKG shows rate of 76 bpm, normal sinus rhythm with normal axis. No acute ST-T wave changes.  Diet review for nutrition referral? Yes ____  Not Indicated _X_   Patient Instructions (the written plan) was given to the patient.  Medicare Attestation I have personally reviewed: The patient's medical and social history Their use of alcohol, tobacco or illicit drugs Their current medications and supplements The patient's functional ability  including ADLs,fall risks, home safety risks, cognitive, and hearing and visual impairment Diet and physical activities Evidence for depression or mood disorders  The patient's weight, height, BMI, and visual acuity have been recorded in the chart.  I have made referrals, counseling, and provided education to the patient based on review of the above and I have provided the patient with a written personalized care plan for preventive services.     METHENEY,CATHERINE, MD   06/11/2014

## 2014-06-11 NOTE — Patient Instructions (Signed)
Recheck cholesterol in about 2 months after you start the Lipitor ( atorvastatin).  Keep up a regular exercise program and make sure you are eating a healthy diet Try to eat 4 servings of dairy a day, or if you are lactose intolerant take a calcium with vitamin D daily.  Your vaccines are up to date.

## 2014-06-22 LAB — LIPID PANEL
Cholesterol: 158 mg/dL (ref 0–200)
HDL: 38 mg/dL — AB (ref >39)
LDL Cholesterol: 59 mg/dL (ref 0–99)
Total CHOL/HDL Ratio: 4.2 Ratio
Triglycerides: 305 mg/dL — ABNORMAL HIGH (ref <150)
VLDL: 61 mg/dL — ABNORMAL HIGH (ref 0–40)

## 2014-06-22 LAB — COMPLETE METABOLIC PANEL WITH GFR
ALBUMIN: 4.7 g/dL (ref 3.5–5.2)
ALT: 12 U/L (ref 0–53)
AST: 15 U/L (ref 0–37)
Alkaline Phosphatase: 55 U/L (ref 39–117)
BUN: 14 mg/dL (ref 6–23)
CALCIUM: 8.9 mg/dL (ref 8.4–10.5)
CHLORIDE: 104 meq/L (ref 96–112)
CO2: 28 meq/L (ref 19–32)
CREATININE: 0.83 mg/dL (ref 0.50–1.35)
GFR, Est Non African American: >89 mL/min
Glucose, Bld: 99 mg/dL (ref 70–99)
Potassium: 4.5 mEq/L (ref 3.5–5.3)
Sodium: 139 mEq/L (ref 135–145)
TOTAL PROTEIN: 6.6 g/dL (ref 6.0–8.3)
Total Bilirubin: 0.4 mg/dL (ref 0.2–1.2)

## 2014-08-16 DIAGNOSIS — M9901 Segmental and somatic dysfunction of cervical region: Secondary | ICD-10-CM | POA: Diagnosis not present

## 2014-08-16 DIAGNOSIS — M5030 Other cervical disc degeneration, unspecified cervical region: Secondary | ICD-10-CM | POA: Diagnosis not present

## 2014-08-16 DIAGNOSIS — G441 Vascular headache, not elsewhere classified: Secondary | ICD-10-CM | POA: Diagnosis not present

## 2014-08-16 DIAGNOSIS — M542 Cervicalgia: Secondary | ICD-10-CM | POA: Diagnosis not present

## 2014-08-26 DIAGNOSIS — M542 Cervicalgia: Secondary | ICD-10-CM | POA: Diagnosis not present

## 2014-08-26 DIAGNOSIS — G441 Vascular headache, not elsewhere classified: Secondary | ICD-10-CM | POA: Diagnosis not present

## 2014-08-26 DIAGNOSIS — M5030 Other cervical disc degeneration, unspecified cervical region: Secondary | ICD-10-CM | POA: Diagnosis not present

## 2014-08-26 DIAGNOSIS — M9901 Segmental and somatic dysfunction of cervical region: Secondary | ICD-10-CM | POA: Diagnosis not present

## 2014-09-06 DIAGNOSIS — M9901 Segmental and somatic dysfunction of cervical region: Secondary | ICD-10-CM | POA: Diagnosis not present

## 2014-09-06 DIAGNOSIS — M542 Cervicalgia: Secondary | ICD-10-CM | POA: Diagnosis not present

## 2014-09-06 DIAGNOSIS — M5030 Other cervical disc degeneration, unspecified cervical region: Secondary | ICD-10-CM | POA: Diagnosis not present

## 2014-09-06 DIAGNOSIS — G441 Vascular headache, not elsewhere classified: Secondary | ICD-10-CM | POA: Diagnosis not present

## 2014-09-17 DIAGNOSIS — M542 Cervicalgia: Secondary | ICD-10-CM | POA: Diagnosis not present

## 2014-09-17 DIAGNOSIS — M5030 Other cervical disc degeneration, unspecified cervical region: Secondary | ICD-10-CM | POA: Diagnosis not present

## 2014-09-17 DIAGNOSIS — M9901 Segmental and somatic dysfunction of cervical region: Secondary | ICD-10-CM | POA: Diagnosis not present

## 2014-09-17 DIAGNOSIS — G441 Vascular headache, not elsewhere classified: Secondary | ICD-10-CM | POA: Diagnosis not present

## 2014-09-30 DIAGNOSIS — G441 Vascular headache, not elsewhere classified: Secondary | ICD-10-CM | POA: Diagnosis not present

## 2014-09-30 DIAGNOSIS — M9901 Segmental and somatic dysfunction of cervical region: Secondary | ICD-10-CM | POA: Diagnosis not present

## 2014-09-30 DIAGNOSIS — M5030 Other cervical disc degeneration, unspecified cervical region: Secondary | ICD-10-CM | POA: Diagnosis not present

## 2014-09-30 DIAGNOSIS — M542 Cervicalgia: Secondary | ICD-10-CM | POA: Diagnosis not present

## 2014-10-11 DIAGNOSIS — M9901 Segmental and somatic dysfunction of cervical region: Secondary | ICD-10-CM | POA: Diagnosis not present

## 2014-10-11 DIAGNOSIS — M5030 Other cervical disc degeneration, unspecified cervical region: Secondary | ICD-10-CM | POA: Diagnosis not present

## 2014-10-11 DIAGNOSIS — G441 Vascular headache, not elsewhere classified: Secondary | ICD-10-CM | POA: Diagnosis not present

## 2014-10-11 DIAGNOSIS — M542 Cervicalgia: Secondary | ICD-10-CM | POA: Diagnosis not present

## 2014-10-22 DIAGNOSIS — G441 Vascular headache, not elsewhere classified: Secondary | ICD-10-CM | POA: Diagnosis not present

## 2014-10-22 DIAGNOSIS — M5030 Other cervical disc degeneration, unspecified cervical region: Secondary | ICD-10-CM | POA: Diagnosis not present

## 2014-10-22 DIAGNOSIS — M542 Cervicalgia: Secondary | ICD-10-CM | POA: Diagnosis not present

## 2014-10-22 DIAGNOSIS — M9901 Segmental and somatic dysfunction of cervical region: Secondary | ICD-10-CM | POA: Diagnosis not present

## 2014-11-08 DIAGNOSIS — M542 Cervicalgia: Secondary | ICD-10-CM | POA: Diagnosis not present

## 2014-11-08 DIAGNOSIS — M5030 Other cervical disc degeneration, unspecified cervical region: Secondary | ICD-10-CM | POA: Diagnosis not present

## 2014-11-08 DIAGNOSIS — G441 Vascular headache, not elsewhere classified: Secondary | ICD-10-CM | POA: Diagnosis not present

## 2014-11-08 DIAGNOSIS — M9901 Segmental and somatic dysfunction of cervical region: Secondary | ICD-10-CM | POA: Diagnosis not present

## 2014-11-23 DIAGNOSIS — M9901 Segmental and somatic dysfunction of cervical region: Secondary | ICD-10-CM | POA: Diagnosis not present

## 2014-11-23 DIAGNOSIS — M542 Cervicalgia: Secondary | ICD-10-CM | POA: Diagnosis not present

## 2014-11-23 DIAGNOSIS — G441 Vascular headache, not elsewhere classified: Secondary | ICD-10-CM | POA: Diagnosis not present

## 2014-11-23 DIAGNOSIS — M5030 Other cervical disc degeneration, unspecified cervical region: Secondary | ICD-10-CM | POA: Diagnosis not present

## 2014-12-03 DIAGNOSIS — M9901 Segmental and somatic dysfunction of cervical region: Secondary | ICD-10-CM | POA: Diagnosis not present

## 2014-12-03 DIAGNOSIS — M5030 Other cervical disc degeneration, unspecified cervical region: Secondary | ICD-10-CM | POA: Diagnosis not present

## 2014-12-03 DIAGNOSIS — G441 Vascular headache, not elsewhere classified: Secondary | ICD-10-CM | POA: Diagnosis not present

## 2014-12-03 DIAGNOSIS — M542 Cervicalgia: Secondary | ICD-10-CM | POA: Diagnosis not present

## 2014-12-06 ENCOUNTER — Telehealth: Payer: Self-pay

## 2014-12-06 DIAGNOSIS — E785 Hyperlipidemia, unspecified: Secondary | ICD-10-CM | POA: Diagnosis not present

## 2014-12-06 NOTE — Telephone Encounter (Signed)
Faxed labs to [facility].

## 2014-12-08 LAB — LIPID PANEL
CHOL/HDL RATIO: 4.5 ratio
CHOLESTEROL: 159 mg/dL (ref 0–200)
HDL: 35 mg/dL — AB (ref >=40)
LDL Cholesterol: 73 mg/dL (ref 0–99)
TRIGLYCERIDES: 256 mg/dL — AB (ref <150)
VLDL: 51 mg/dL — ABNORMAL HIGH (ref 0–40)

## 2014-12-08 LAB — COMPLETE METABOLIC PANEL WITH GFR
ALK PHOS: 56 U/L (ref 39–117)
ALT: 13 U/L (ref 0–53)
AST: 15 U/L (ref 0–37)
Albumin: 4.7 g/dL (ref 3.5–5.2)
BUN: 14 mg/dL (ref 6–23)
CO2: 25 meq/L (ref 19–32)
CREATININE: 0.85 mg/dL (ref 0.50–1.35)
Calcium: 9.2 mg/dL (ref 8.4–10.5)
Chloride: 104 mEq/L (ref 96–112)
GLUCOSE: 105 mg/dL — AB (ref 70–99)
Potassium: 4.2 mEq/L (ref 3.5–5.3)
Sodium: 139 mEq/L (ref 135–145)
Total Bilirubin: 0.5 mg/dL (ref 0.2–1.2)
Total Protein: 6.9 g/dL (ref 6.0–8.3)

## 2014-12-17 DIAGNOSIS — M9901 Segmental and somatic dysfunction of cervical region: Secondary | ICD-10-CM | POA: Diagnosis not present

## 2014-12-17 DIAGNOSIS — G441 Vascular headache, not elsewhere classified: Secondary | ICD-10-CM | POA: Diagnosis not present

## 2014-12-17 DIAGNOSIS — M542 Cervicalgia: Secondary | ICD-10-CM | POA: Diagnosis not present

## 2014-12-17 DIAGNOSIS — M5030 Other cervical disc degeneration, unspecified cervical region: Secondary | ICD-10-CM | POA: Diagnosis not present

## 2015-10-17 ENCOUNTER — Ambulatory Visit (INDEPENDENT_AMBULATORY_CARE_PROVIDER_SITE_OTHER): Payer: Medicare Other | Admitting: Family Medicine

## 2015-10-17 ENCOUNTER — Encounter: Payer: Self-pay | Admitting: Family Medicine

## 2015-10-17 VITALS — BP 148/71 | HR 66 | Wt 182.0 lb

## 2015-10-17 DIAGNOSIS — H9313 Tinnitus, bilateral: Secondary | ICD-10-CM

## 2015-10-17 DIAGNOSIS — E785 Hyperlipidemia, unspecified: Secondary | ICD-10-CM

## 2015-10-17 DIAGNOSIS — Z0000 Encounter for general adult medical examination without abnormal findings: Secondary | ICD-10-CM

## 2015-10-17 DIAGNOSIS — Z23 Encounter for immunization: Secondary | ICD-10-CM | POA: Diagnosis not present

## 2015-10-17 DIAGNOSIS — Z1159 Encounter for screening for other viral diseases: Secondary | ICD-10-CM | POA: Diagnosis not present

## 2015-10-17 DIAGNOSIS — N529 Male erectile dysfunction, unspecified: Secondary | ICD-10-CM | POA: Insufficient documentation

## 2015-10-17 DIAGNOSIS — H9319 Tinnitus, unspecified ear: Secondary | ICD-10-CM | POA: Insufficient documentation

## 2015-10-17 DIAGNOSIS — N528 Other male erectile dysfunction: Secondary | ICD-10-CM

## 2015-10-17 DIAGNOSIS — Z125 Encounter for screening for malignant neoplasm of prostate: Secondary | ICD-10-CM

## 2015-10-17 DIAGNOSIS — H6123 Impacted cerumen, bilateral: Secondary | ICD-10-CM

## 2015-10-17 MED ORDER — ATORVASTATIN CALCIUM 40 MG PO TABS: 40.0000 mg | ORAL_TABLET | Freq: Every day | ORAL | Status: DC

## 2015-10-17 NOTE — Progress Notes (Signed)
Subjective:    Patient ID: Dennis Waller, male    DOB: [birth year], 65 y.o.   MRN: [id number]  HPI    Review of Systems     Objective:   Physical Exam        Assessment & Plan:    Subjective:    Dennis Waller is a 65 y.o. male who presents for Medicare Annual/Subsequent preventive examination.   Preventive Screening-Counseling & Management  Tobacco History  Smoking status  . Current Every Day Smoker -- 1.00 packs/day  . Types: Cigarettes  Smokeless tobacco  . Not on file    Problems Prior to Visit 1.   Current Problems (verified) Patient Active Problem List   Diagnosis Date Noted  . Cataract 02/18/2012  . GERD (gastroesophageal reflux disease) 02/18/2012  . SCIATICA 06/22/2010  . Hyperlipidemia 05/24/2008  . MICROSCOPIC HEMATURIA 05/24/2008  . URINARY URGENCY 05/24/2008    Medications Prior to Visit Current Outpatient Prescriptions on File Prior to Visit  Medication Sig Dispense Refill  . atorvastatin (LIPITOR) 40 MG tablet Take 1 tablet (40 mg total) by mouth daily. 90 tablet 3  . Multiple Vitamins-Minerals (OCUVITE ADULT 50+ PO) Take 1 tablet by mouth daily.    . ranitidine (ZANTAC) 75 MG tablet Take 75 mg by mouth 2 (two) times daily.      . sildenafil (VIAGRA) 50 MG tablet Take 1 tablet (50 mg total) by mouth daily as needed for erectile dysfunction. 10 tablet 6   No current facility-administered medications on file prior to visit.    Current Medications (verified) Current Outpatient Prescriptions  Medication Sig Dispense Refill  . atorvastatin (LIPITOR) 40 MG tablet Take 1 tablet (40 mg total) by mouth daily. 90 tablet 3  . Multiple Vitamins-Minerals (OCUVITE ADULT 50+ PO) Take 1 tablet by mouth daily.    . ranitidine (ZANTAC) 75 MG tablet Take 75 mg by mouth 2 (two) times daily.      . sildenafil (VIAGRA) 50 MG tablet Take 1 tablet (50 mg total) by mouth daily as needed for erectile dysfunction. 10 tablet 6   No current  facility-administered medications for this visit.     Allergies (verified) Review of patient's allergies indicates no known allergies.   PAST HISTORY  Family History Family History  Problem Relation Age of Onset  . Diabetes Mother   . Hypertension Mother   . Cancer Mother     colon  . COPD Mother   . Hyperlipidemia Father   . Hypertension Father     Social History Social History  Substance Use Topics  . Smoking status: Current Every Day Smoker -- 1.00 packs/day    Types: Cigarettes  . Smokeless tobacco: Not on file  . Alcohol Use: Yes    Are there smokers in your home (other than you)?  No  Risk Factors Current exercise habits: just started exercising again recently. He does 5 miles some stationary bike and walks 2 miles 4-5 times per week  Dietary issues discussed: He has really changed his diet recently and really increase the fruits and eating more lean meats. He has cut out fried foods and has mostly switch to water. He still drinks about 3 -20 ounce bottles of soda per day but this is down from drinking 2-3 20 ounce sodas per day.   Cardiac risk factors: advanced age (older than 2 for men, 20 for women) and male gender.  Depression Screen (Note: if answer to either of the following is "Yes", a more  complete depression screening is indicated)   Q1: Over the past two weeks, have you felt down, depressed or hopeless? No  Q2: Over the past two weeks, have you felt little interest or pleasure in doing things? No  Have you lost interest or pleasure in daily life? No  Do you often feel hopeless? No  Do you cry easily over simple problems? No  Activities of Daily Living In your present state of health, do you have any difficulty performing the following activities?:  Driving? No Managing money?  No Feeding yourself? No Getting from bed to chair? No Climbing a flight of stairs? No Preparing food and eating?: No Bathing or showering? No Getting dressed: No Getting  to the toilet? No Using the toilet:No Moving around from place to place: No In the past year have you fallen or had a near fall?:No   Are you sexually active?  Yes  Do you have more than one partner?  Yes  Hearing Difficulties: No Do you often ask people to speak up or repeat themselves? No Do you experience ringing or noises in your ears? Yes Do you have difficulty understanding soft or whispered voices? No   Do you feel that you have a problem with memory? No  Do you often misplace items? No  Do you feel safe at home?  Yes  Cognitive Testing  Alert? Yes  Normal Appearance?Yes  Oriented to person? Yes  Place? Yes   Time? Yes  Recall of three objects?  Yes  Can perform simple calculations? Yes  Displays appropriate judgment?Yes  Can read the correct time from a watch face?Yes   Advanced Directives have been discussed with the patient? Yes   List the Names of Other Physician/Practitioners you currently use: 1.    Indicate any recent Medical Services you may have received from other than [facility] providers in the past year (date may be approximate).  Immunization History  Administered Date(s) Administered  . Influenza,inj,Quad PF,36+ Mos 07/02/2013, 06/11/2014, 10/17/2015  . Pneumococcal Conjugate-13 06/11/2014  . Pneumococcal Polysaccharide-23 08/13/2004  . Tdap 03/09/2011  . Zoster 03/09/2011    Screening Tests Health Maintenance  Topic Date Due  . Hepatitis C Screening  1951-04-04  . INFLUENZA VACCINE  03/14/2015  . PNA vac Low Risk Adult (2 of 2 - PPSV23) 06/12/2015  . TETANUS/TDAP  03/08/2021  . COLONOSCOPY  04/22/2021  . ZOSTAVAX  Completed    All answers were reviewed with the patient and necessary referrals were made:  METHENEY,CATHERINE, MD   10/17/2015   History reviewed: allergies, current medications, past family history, past medical history, past social history, past surgical history and problem list  Review of Systems A comprehensive review of  systems was negative.    Objective:     Vision by Snellen chart: will call for up to date eye exam.   Blood pressure 148/71, pulse 66, weight 182 lb (82.555 kg), SpO2 98 %. Body mass index is 26.86 kg/(m^2).  BP 148/71 mmHg  Pulse 66  Wt 182 lb (82.555 kg)  SpO2 98% General appearance: alert, cooperative and appears stated age Head: Normocephalic, without obvious abnormality, atraumatic Eyes: conj clear, EOMi, PEERLA Ears: normal TM's and external ear canals both ears Nose: Nares normal. Septum midline. Mucosa normal. No drainage or sinus tenderness. Throat: lips, mucosa, and tongue normal; teeth and gums normal Neck: no adenopathy, no carotid bruit, no JVD, supple, symmetrical, trachea midline and thyroid not enlarged, symmetric, no tenderness/mass/nodules Back: symmetric, no curvature. ROM normal. No  CVA tenderness. Lungs: clear to auscultation bilaterally Chest wall: no tenderness Heart: regular rate and rhythm, S1, S2 normal, no murmur, click, rub or gallop Abdomen: soft, non-tender; bowel sounds normal; no masses,  no organomegaly Rectal: normal tone, normal prostate, no masses or tenderness Extremities: extremities normal, atraumatic, no cyanosis or edema Pulses: 2+ and symmetric Skin: Skin color, texture, turgor normal. No rashes or lesions Lymph nodes: Cervical, supraclavicular, and axillary nodes normal. Neurologic: Alert and oriented X 3, normal strength and tone. Normal symmetric reflexes. Normal coordination and gait     Assessment:     Medicare Annual Wellness Exam       Plan:     During the course of the visit the patient was educated and counseled about appropriate screening and preventive services including:    Pneumococcal vaccine   Influenza vaccine  Hep C screening   He seen a chiropractor for his back regularly and this has been helpful. He's been able to decrease his visits to about every 6 weeks.  Cerumen impaction bilaterally-he has noticed  increased ringing in his ears recently. Irrigation performed.  Isn't under a lot of stress this year dealing with health problems of family members. He has a son-in-law he is currently dealing with cancer. A friend in his cortex she is also dealing with cancer.  Hyperlipidemia-he admits he ran out of his Lipitor and has been off of it for about a month. He does appear new perception sent to the pharmacy.  Diet review for nutrition referral? Yes ____  Not Indicated __x__   Patient Instructions (the written plan) was given to the patient.  Medicare Attestation I have personally reviewed: The patient's medical and social history Their use of alcohol, tobacco or illicit drugs Their current medications and supplements The patient's functional ability including ADLs,fall risks, home safety risks, cognitive, and hearing and visual impairment Diet and physical activities Evidence for depression or mood disorders  The patient's weight, height, BMI, and visual acuity have been recorded in the chart.  I have made referrals, counseling, and provided education to the patient based on review of the above and I have provided the patient with a written personalized care plan for preventive services.     METHENEY,CATHERINE, MD   10/17/2015

## 2015-11-29 DIAGNOSIS — H2513 Age-related nuclear cataract, bilateral: Secondary | ICD-10-CM | POA: Diagnosis not present

## 2016-04-17 ENCOUNTER — Ambulatory Visit (INDEPENDENT_AMBULATORY_CARE_PROVIDER_SITE_OTHER): Payer: Medicare Other | Admitting: Family Medicine

## 2016-04-17 ENCOUNTER — Encounter: Payer: Self-pay | Admitting: Family Medicine

## 2016-04-17 VITALS — BP 130/68 | HR 67 | Wt 182.0 lb

## 2016-04-17 DIAGNOSIS — Z0000 Encounter for general adult medical examination without abnormal findings: Secondary | ICD-10-CM | POA: Diagnosis not present

## 2016-04-17 DIAGNOSIS — Z1159 Encounter for screening for other viral diseases: Secondary | ICD-10-CM | POA: Diagnosis not present

## 2016-04-17 DIAGNOSIS — F17219 Nicotine dependence, cigarettes, with unspecified nicotine-induced disorders: Secondary | ICD-10-CM

## 2016-04-17 DIAGNOSIS — Z23 Encounter for immunization: Secondary | ICD-10-CM | POA: Diagnosis not present

## 2016-04-17 DIAGNOSIS — E785 Hyperlipidemia, unspecified: Secondary | ICD-10-CM | POA: Diagnosis not present

## 2016-04-17 DIAGNOSIS — Z125 Encounter for screening for malignant neoplasm of prostate: Secondary | ICD-10-CM | POA: Diagnosis not present

## 2016-04-17 LAB — COMPLETE METABOLIC PANEL WITH GFR
ALT: 13 U/L (ref 9–46)
AST: 14 U/L (ref 10–35)
Albumin: 4.6 g/dL (ref 3.6–5.1)
Alkaline Phosphatase: 64 U/L (ref 40–115)
BILIRUBIN TOTAL: 0.3 mg/dL (ref 0.2–1.2)
BUN: 19 mg/dL (ref 7–25)
CHLORIDE: 105 mmol/L (ref 98–110)
CO2: 24 mmol/L (ref 20–31)
Calcium: 9.2 mg/dL (ref 8.6–10.3)
Creat: 0.93 mg/dL (ref 0.70–1.25)
GFR, EST NON AFRICAN AMERICAN: 85 mL/min (ref >=60)
Glucose, Bld: 89 mg/dL (ref 65–99)
Potassium: 4.4 mmol/L (ref 3.5–5.3)
Sodium: 137 mmol/L (ref 135–146)
TOTAL PROTEIN: 6.8 g/dL (ref 6.1–8.1)

## 2016-04-17 LAB — LIPID PANEL
Cholesterol: 154 mg/dL (ref 125–200)
HDL: 38 mg/dL — AB (ref >=40)
LDL Cholesterol: 43 mg/dL (ref <130)
TRIGLYCERIDES: 366 mg/dL — AB (ref <150)
Total CHOL/HDL Ratio: 4.1 Ratio (ref <=5.0)
VLDL: 73 mg/dL — ABNORMAL HIGH (ref <30)

## 2016-04-17 MED ORDER — VARENICLINE TARTRATE 0.5 MG X 11 & 1 MG X 42 PO MISC: ORAL | 0 refills | Status: DC

## 2016-04-17 NOTE — Progress Notes (Signed)
   Subjective:    Patient ID: Dennis Waller, male    DOB: 1950-08-15, 65 y.o.   MRN: [id number]  HPI Here today to discuss smoking cessation.  He was able to quit for 30 days cold [place] Years ago. He has never used any medication to help him quit. He is currently down to about half a pack per day. He says his motives difficult times our first thing in the morning with his coffee and then after meals and when he is stressed. He does not wake up at night to smoke. And he does usually smoke his first cigarette within an hour of getting up in the mornings. He is also interested in having the lung cancer low-dose CT screening.   Review of Systems     Objective:   Physical Exam  Constitutional: He is oriented to person, place, and time. He appears well-developed and well-nourished.  HENT:  Head: Normocephalic and atraumatic.  Eyes: Conjunctivae and EOM are normal.  Cardiovascular: Normal rate.   Pulmonary/Chest: Effort normal.  Neurological: He is alert and oriented to person, place, and time.  Skin: Skin is dry. No pallor.  Psychiatric: He has a normal mood and affect. His behavior is normal.  Vitals reviewed.     Assessment & Plan:  Nicotine dependence-discussed strategies to quit including nicotine replacement, Chantix, Wellbutrin. Discussed pros and cons of each drug.We decided on Chantix. Discussed potential side effects of the drug. Call if any problems or concerns. Make sure to take with food and water. Encouraged him to complete a 90 day course if he starting the medication well and we can certainly extend beyond that if needed. Encouraged him to do a quick program along with the medication. Explained how this can increase success rates.  Time spent 20 min, > 50% spent counseling about smoking cessation.    Flu shot given today.  Recommend low-dose CT lung cancer screening. Referral placed.

## 2016-04-18 ENCOUNTER — Other Ambulatory Visit: Payer: Self-pay | Admitting: Acute Care

## 2016-04-18 DIAGNOSIS — F17210 Nicotine dependence, cigarettes, uncomplicated: Principal | ICD-10-CM

## 2016-04-18 LAB — PSA: PSA: 2.9 ng/mL (ref <=4.0)

## 2016-04-18 LAB — HEPATITIS C ANTIBODY: HCV Ab: NEGATIVE

## 2016-04-25 ENCOUNTER — Ambulatory Visit (INDEPENDENT_AMBULATORY_CARE_PROVIDER_SITE_OTHER): Payer: Medicare Other | Admitting: Acute Care

## 2016-04-25 ENCOUNTER — Encounter: Payer: Self-pay | Admitting: Acute Care

## 2016-04-25 ENCOUNTER — Ambulatory Visit (INDEPENDENT_AMBULATORY_CARE_PROVIDER_SITE_OTHER): Payer: Medicare Other

## 2016-04-25 DIAGNOSIS — I700 Atherosclerosis of aorta: Secondary | ICD-10-CM

## 2016-04-25 DIAGNOSIS — F17210 Nicotine dependence, cigarettes, uncomplicated: Secondary | ICD-10-CM

## 2016-04-25 DIAGNOSIS — Z122 Encounter for screening for malignant neoplasm of respiratory organs: Secondary | ICD-10-CM | POA: Diagnosis not present

## 2016-04-25 DIAGNOSIS — I2510 Atherosclerotic heart disease of native coronary artery without angina pectoris: Secondary | ICD-10-CM

## 2016-04-25 NOTE — Progress Notes (Signed)
Shared Decision Making Visit Lung Cancer Screening Program [phone])   Eligibility:  Age 65 y.o.  Pack Years Smoking History Calculation 30+ pack years (# packs/per year x # years smoked)  Recent History of coughing up blood  no  Unexplained weight loss? no ( >Than 15 pounds within the last 6 months )  Prior History Lung / other cancer no (Diagnosis within the last 5 years already requiring surveillance chest CT Scans).  Smoking Status Current Smoker  Former Smokers: Years since quit: NA  Quit Date: NA  Visit Components:  Discussion included one or more decision making aids. yes  Discussion included risk/benefits of screening. yes  Discussion included potential follow up diagnostic testing for abnormal scans. yes  Discussion included meaning and risk of over diagnosis. yes  Discussion included meaning and risk of False Positives. yes  Discussion included meaning of total radiation exposure. yes  Counseling Included:  Importance of adherence to annual lung cancer LDCT screening. yes  Impact of comorbidities on ability to participate in the program. yes  Ability and willingness to under diagnostic treatment. yes  Smoking Cessation Counseling:  Current Smokers:   Discussed importance of smoking cessation. yes  Information about tobacco cessation classes and interventions provided to patient. yes  Patient provided with "ticket" for LDCT Scan. yes  Symptomatic Patient. no  Counseling  Diagnosis Code: Tobacco Use Z72.0  Asymptomatic Patient yes  Counseling (Intermediate counseling: > three minutes counseling) [id number]  Former Smokers:   Discussed the importance of maintaining cigarette abstinence. yes  Diagnosis Code: Personal History of Nicotine Dependence. [id number]  Information about tobacco cessation classes and interventions provided to patient. Yes  Patient provided with "ticket" for LDCT Scan. yes  Written Order for Lung Cancer Screening with LDCT  placed in Epic. Yes (CT Chest Lung Cancer Screening Low Dose W/O CM) [id number] Z12.2-Screening of respiratory organs Z87.891-Personal history of nicotine dependence  I have spent 20 minutes of face to face time with Mr. Dobin discussing the risks and benefits of lung cancer screening. We viewed a power point together that explained in detail the above noted topics. We paused at intervals to allow for questions to be asked and answered to ensure understanding.We discussed that the single most powerful action that he can take to decrease his risk of developing lung cancer is to quit smoking. We discussed whether or not he is ready to commit to setting a quit date. He is currently taking Chantix, and is smoking one cigarette a day. He is very motivated to quit and has good support.We discussed options for tools to aid in quitting smoking including nicotine replacement therapy, non-nicotine medications, support groups, Quit Smart classes, and behavior modification. We discussed that often times setting smaller, more achievable goals, such as eliminating 1 cigarette a day for a week and then 2 cigarettes a day for a week can be helpful in slowly decreasing the number of cigarettes smoked. This allows for a sense of accomplishment as well as providing a clinical benefit. I gave him the " Be Stronger Than Your Excuses" card with contact information for community resources, classes, free nicotine replacement therapy, and access to mobile apps, text messaging, and on-line smoking cessation help. I have also given him my card and contact information in the event he needs to contact me. We discussed the time and location of the scan, and that either June Leap, CMA, or I will call with the results within 24-48 hours of receiving them. I have provided him with  a copy of the power point we viewed  as a resource in the event they need reinforcement of the concepts we discussed today in the office. The patient verbalized  understanding of all of  the above and had no further questions upon leaving the office. They have my contact information in the event they have any further questions.   Magdalen Spatz, NP 04/25/2016

## 2016-04-26 ENCOUNTER — Telehealth: Payer: Self-pay | Admitting: Acute Care

## 2016-04-26 DIAGNOSIS — F17210 Nicotine dependence, cigarettes, uncomplicated: Principal | ICD-10-CM

## 2016-04-26 NOTE — Telephone Encounter (Signed)
I have called the results of Mr. Dennis Waller's low-dose CT to his cell phone voicemail per his request. His scan was read as a lung RADS 1,negative study: no nodules or definitely benign nodules. Radiology recommendation is for a repeat LDCT in 12 months. We will order and schedule his next annual screening for September 2018 per the the program protocol. There was also incidental finding of aortic atherosclerosis and three-vessel coronary artery calcification, which I also shared with Mr. Dennis Waller. He is currently taking a statin and a daily baby aspirin per his primary care provider. I will fax a copy of this report to Dr. Meredith Staggers the patient's primary care provider, in order that she can follow-up as she feels is clinically indicated as she knows this patient's health history well. I have left Mr. Dennis Waller my contact information and phone number in the event he has any further questions regarding reading of his scan.

## 2016-05-11 ENCOUNTER — Other Ambulatory Visit: Payer: Self-pay | Admitting: Family Medicine

## 2016-05-29 ENCOUNTER — Ambulatory Visit: Payer: Medicare Other | Admitting: Family Medicine

## 2016-06-07 ENCOUNTER — Other Ambulatory Visit: Payer: Self-pay

## 2016-06-07 MED ORDER — VARENICLINE TARTRATE 1 MG PO TABS: 1.0000 mg | ORAL_TABLET | Freq: Two times a day (BID) | ORAL | 1 refills | Status: DC

## 2016-07-23 ENCOUNTER — Other Ambulatory Visit: Payer: Self-pay | Admitting: Family Medicine

## 2016-09-10 ENCOUNTER — Other Ambulatory Visit: Payer: Self-pay | Admitting: Family Medicine

## 2017-04-26 ENCOUNTER — Ambulatory Visit: Payer: Medicare Other

## 2017-04-26 DIAGNOSIS — Z87891 Personal history of nicotine dependence: Secondary | ICD-10-CM | POA: Diagnosis not present

## 2017-04-26 DIAGNOSIS — F17210 Nicotine dependence, cigarettes, uncomplicated: Principal | ICD-10-CM

## 2017-05-01 ENCOUNTER — Other Ambulatory Visit: Payer: Self-pay | Admitting: Acute Care

## 2017-05-01 DIAGNOSIS — Z122 Encounter for screening for malignant neoplasm of respiratory organs: Secondary | ICD-10-CM

## 2017-05-01 DIAGNOSIS — Z87891 Personal history of nicotine dependence: Secondary | ICD-10-CM

## 2017-06-12 ENCOUNTER — Telehealth: Payer: Self-pay | Admitting: *Deleted

## 2017-06-12 ENCOUNTER — Ambulatory Visit (INDEPENDENT_AMBULATORY_CARE_PROVIDER_SITE_OTHER): Payer: Medicare Other | Admitting: Family Medicine

## 2017-06-12 VITALS — BP 123/65 | HR 80 | Temp 98.5°F | Ht 70.0 in | Wt 189.9 lb

## 2017-06-12 DIAGNOSIS — Z23 Encounter for immunization: Secondary | ICD-10-CM | POA: Diagnosis not present

## 2017-06-12 NOTE — Telephone Encounter (Signed)
Pt informed me that his father Gilberto Better Ilona Sorrel) Dax passed away on 2017-05-08. He took him back to [place] to be laid to rest. .Elouise Munroe

## 2017-06-12 NOTE — Progress Notes (Signed)
   Subjective:    Patient ID: Dennis Waller, male    DOB: 1951-02-19, 66 y.o.   MRN: [id number]  HPI Pt is here for flu vaccine. Pt denies egg allergy, fever, chest pain, SOB, or any other problems.     Review of Systems     Objective:   Physical Exam        Assessment & Plan:  Pt tolerated injection in Left deltoid well and without complications.

## 2017-06-23 DIAGNOSIS — J0121 Acute recurrent ethmoidal sinusitis: Secondary | ICD-10-CM | POA: Diagnosis not present

## 2017-06-23 DIAGNOSIS — J208 Acute bronchitis due to other specified organisms: Secondary | ICD-10-CM | POA: Diagnosis not present

## 2017-06-23 DIAGNOSIS — E785 Hyperlipidemia, unspecified: Secondary | ICD-10-CM | POA: Diagnosis not present

## 2017-06-23 DIAGNOSIS — Z87891 Personal history of nicotine dependence: Secondary | ICD-10-CM | POA: Diagnosis not present

## 2017-07-11 ENCOUNTER — Ambulatory Visit (INDEPENDENT_AMBULATORY_CARE_PROVIDER_SITE_OTHER): Payer: Medicare Other | Admitting: Family Medicine

## 2017-07-11 ENCOUNTER — Encounter: Payer: Self-pay | Admitting: Family Medicine

## 2017-07-11 VITALS — BP 138/84 | HR 65 | Temp 98.3°F | Ht 70.0 in | Wt 190.0 lb

## 2017-07-11 DIAGNOSIS — J40 Bronchitis, not specified as acute or chronic: Secondary | ICD-10-CM

## 2017-07-11 DIAGNOSIS — F4321 Adjustment disorder with depressed mood: Secondary | ICD-10-CM | POA: Diagnosis not present

## 2017-07-11 DIAGNOSIS — H65193 Other acute nonsuppurative otitis media, bilateral: Secondary | ICD-10-CM

## 2017-07-11 NOTE — Progress Notes (Signed)
   Subjective:    Patient ID: Dennis Waller, male    DOB: 01-02-1951, 66 y.o.   MRN: [id number]  HPI 66 year old male comes in today complaining of upper respiratory symptoms. Was recently in [place] about 2 weeks ago for "severe bronchitis". Given prednisone 20mg , Augementin and a cough medication. He is feeling better overall.  He still has a little bit of drainage and just a little bit of ear pressure but overall is feeling significantly better.  He was told that he had a lot of fluid in his ears which is why he was given prednisone because he was actually on flight back home the next day.  He said he had a lot of pain and pressure on the flight home.  He was able to stop his cough syrup about a week ago.  While there he was told that he had a heart murmur and wanted to get that checked out.  He has not had any chest pain wheezing shortness of breath.  No heart palpitations.  He is doing well overall.  His father recently passed away in his best friend passed away around Thanksgiving.  He has been grieving but feels like he is doing okay.  Review of Systems     Objective:   Physical Exam  Constitutional: He is oriented to person, place, and time. He appears well-developed and well-nourished.  HENT:  Head: Normocephalic and atraumatic.  Right Ear: External ear normal.  Left Ear: External ear normal.  Nose: Nose normal.  Mouth/Throat: Oropharynx is clear and moist.  TMs and canals are clear.   Eyes: Conjunctivae and EOM are normal. Pupils are equal, round, and reactive to light.  Neck: Neck supple. No thyromegaly present.  Cardiovascular: Normal rate and normal heart sounds.  Pulmonary/Chest: Effort normal and breath sounds normal.  Lymphadenopathy:    He has no cervical adenopathy.  Neurological: He is alert and oriented to person, place, and time.  Skin: Skin is warm and dry.  Psychiatric: He has a normal mood and affect.          Assessment & Plan:  Bronchitis - much  improved. Call if any new symptoms.   I didn't here a heart murmur today.  It could be if he was dehydrated or was running a fever at the time certainly that can change hemodynamics and possibly bring out a murmur but today I did not hear one.  If he starts to express any chest discomfort shortness of breath heart palpitations flutter skipping beats etc. please let me know and we can always get an echocardiogram if needed.  Bilateral ear effusions - resolved.    Grieving - doing well overall.

## 2017-10-21 ENCOUNTER — Other Ambulatory Visit: Payer: Self-pay | Admitting: Family Medicine

## 2017-10-21 NOTE — Telephone Encounter (Signed)
Needs labs drawn. KG LPN

## 2017-10-30 ENCOUNTER — Encounter: Payer: Self-pay | Admitting: Family Medicine

## 2017-10-30 ENCOUNTER — Ambulatory Visit (INDEPENDENT_AMBULATORY_CARE_PROVIDER_SITE_OTHER): Payer: Medicare Other | Admitting: Family Medicine

## 2017-10-30 VITALS — BP 148/82 | HR 65 | Ht 70.0 in | Wt 196.0 lb

## 2017-10-30 DIAGNOSIS — R030 Elevated blood-pressure reading, without diagnosis of hypertension: Secondary | ICD-10-CM | POA: Diagnosis not present

## 2017-10-30 DIAGNOSIS — M62838 Other muscle spasm: Secondary | ICD-10-CM | POA: Diagnosis not present

## 2017-10-30 MED ORDER — CYCLOBENZAPRINE HCL 10 MG PO TABS
10.0000 mg | ORAL_TABLET | Freq: Three times a day (TID) | ORAL | 0 refills | Status: DC | PRN
Start: 2017-10-30 — End: 2018-05-28

## 2017-10-30 NOTE — Patient Instructions (Addendum)
Thank you for coming in today. Use a heating pad.  Use a TENS unit.  Continue Tylenol as needed.  Continue topical treatments.  Attend PT.  Recheck with me in 4 weeks if not better.   TENS UNIT: This is helpful for muscle pain and spasm.   Search and Purchase a TENS 7000 2nd edition at  www.tenspros.com or [email or web address] It should be less than $30.     TENS unit instructions: Do not shower or bathe with the unit on Turn the unit off before removing electrodes or batteries If the electrodes lose stickiness add a drop of water to the electrodes after they are disconnected from the unit and place on plastic sheet. If you continued to have difficulty, call the TENS unit company to purchase more electrodes. Do not apply lotion on the skin area prior to use. Make sure the skin is clean and dry as this will help prolong the life of the electrodes. After use, always check skin for unusual red areas, rash or other skin difficulties. If there are any skin problems, does not apply electrodes to the same area. Never remove the electrodes from the unit by pulling the wires. Do not use the TENS unit or electrodes other than as directed. Do not change electrode placement without consultating your therapist or physician. Keep 2 fingers with between each electrode. Wear time ratio is 2:1, on to off times.    For example on for 30 minutes off for 15 minutes and then on for 30 minutes off for 15 minutes

## 2017-10-30 NOTE — Progress Notes (Signed)
Dennis Waller is a 68 y.o. male who presents to [facility] today for left lateral neck pain.  Don notes 5-day history of left lateral neck pain.  He denies any radiating pain weakness or numbness.  The pain is located across the left trapezius and left sternocleidomastoid.  He is tried some topical over-the-counter treatments including Biofreeze which is helped only a little.  He takes ibuprofen and Tylenol for his chronic back pain and notes this is not helped his neck pain much.  He has been to his chiropractor and has had one treatment with no success.  He denies any injury.  Notes the pain is worse with motion and better with rest.  Pain is moderate and interferes with lifting and playing his trombone.  Elevated blood pressure: Patient denies any history of hypertension and does not take medications.  No chest pain palpitation lightheaded dizziness.  Past Medical History:  Diagnosis Date  . Chronic kidney disease    kidney stones  . Hyperlipidemia    Past Surgical History:  Procedure Laterality Date  . [place]   ? lumbar disc fusion  . KIDNEY STONE SURGERY     Social History   Tobacco Use  . Smoking status: Former Smoker    Packs/day: 1.00    Years: 55.00    Pack years: 55.00    Types: Cigarettes    Last attempt to quit: 11/09/2015    Years since quitting: 1.9  . Smokeless tobacco: Never Used  Substance Use Topics  . Alcohol use: Yes     ROS:  As above   Medications: Current Outpatient Medications  Medication Sig Dispense Refill  . atorvastatin (LIPITOR) 40 MG tablet Take 1 tablet (40 mg total) by mouth daily. Please call office to get lab work orders. Needs labs done 90 tablet 0  . Multiple Vitamins-Minerals (OCUVITE ADULT 50+ PO) Take 1 tablet by mouth daily.    . ranitidine (ZANTAC) 75 MG tablet Take 75 mg by mouth 2 (two) times daily.      . cyclobenzaprine (FLEXERIL) 10 MG tablet Take 1 tablet (10 mg  total) by mouth 3 (three) times daily as needed for muscle spasms. 30 tablet 0   No current facility-administered medications for this visit.    No Known Allergies   Exam:  BP (!) 148/82   Pulse 65   Ht 5\' 10"  (1.778 m)   Wt 196 lb (88.9 kg)   BMI 28.12 kg/m  General: Well Developed, well nourished, and in no acute distress.  Neuro/Psych: Alert and oriented x3, extra-ocular muscles intact, able to move all 4 extremities, sensation grossly intact. Skin: Warm and dry, no rashes noted.  Respiratory: Not using accessory muscles, speaking in full sentences, trachea midline.  Cardiovascular: Pulses palpable, no extremity edema. Abdomen: Does not appear distended. MSK:  C-spine: Nontender to spinal midline. Tender to palpation left cervical paraspinal muscle group, trapezius, and sternocleidomastoid Region motion: Normal flexion and extension. Painful and limited left lateral flexion normal right. Painful and limited right rotation and normal left rotation Upper extremity strength and reflexes are equal and normal throughout. Sensation is intact throughout bilateral upper extremities Pulses intact bilateral upper extremities.     Assessment and Plan: 67 y.o. male with new onset left cervical pain likely trapezius spasm.  After discussion plan for treatment with physical therapy, heating pad, TENS unit Biofreeze, and limited cyclobenzaprine at bedtime.  Recheck in 4 weeks if not improved.  Return sooner if worsening or if needed.  No xray at this time.   HTN: Blood pressure a bit elevated today.  Blood pressure typically better controlled.  Ibuprofen may be a factor here.  Recommended reducing ibuprofen and following up with primary care provider in the near future.   Orders Placed This Encounter  Procedures  . Ambulatory referral to Physical Therapy    Referral Priority:   Routine    Referral Type:   Physical Medicine    Referral Reason:   Specialty Services Required     Requested Specialty:   Physical Therapy   Meds ordered this encounter  Medications  . cyclobenzaprine (FLEXERIL) 10 MG tablet    Sig: Take 1 tablet (10 mg total) by mouth 3 (three) times daily as needed for muscle spasms.    Dispense:  30 tablet    Refill:  0    Discussed warning signs or symptoms. Please see discharge instructions. Patient expresses understanding.

## 2017-11-07 ENCOUNTER — Ambulatory Visit: Payer: Medicare Other | Admitting: Rehabilitative and Restorative Service Providers"

## 2017-11-07 ENCOUNTER — Encounter: Payer: Self-pay | Admitting: Rehabilitative and Restorative Service Providers"

## 2017-11-07 DIAGNOSIS — R29898 Other symptoms and signs involving the musculoskeletal system: Secondary | ICD-10-CM | POA: Diagnosis not present

## 2017-11-07 DIAGNOSIS — R293 Abnormal posture: Secondary | ICD-10-CM

## 2017-11-07 NOTE — Patient Instructions (Signed)
Axial Extension (Chin Tuck)    Pull chin in and lengthen back of neck. Hold __5__ seconds while counting out loud. Repeat __10__ times. Do __several__ sessions per day.  Shoulder Blade Squeeze    Rotate shoulders back, then squeeze shoulder blades together. Repeat ____ times. Do ____ sessions per day.  Upper Back Strength: Lower Trapezius / Rotator Cuff " L's "     Arms in waitress pose, palms up. Press hands back and slide shoulder blades down. Hold for __5__ seconds. Repeat _10___ times. 1-2 times per day.    Scapular Retraction: Elbow Flexion (Standing)  "W's"     With elbows bent to 90, pinch shoulder blades together and rotate arms out, keeping elbows bent. Repeat __10__ times per set. Do __1-2__ sets per session. Do _several ___ sessions per day.  Scapula Adduction With Pectoralis Stretch: Low - Standing   Shoulders at 45 hands even with shoulders, keeping weight through legs, shift weight forward until you feel pull or stretch through the front of your chest. Hold _30__ seconds. Do _3__ times, _2-4__ times per day.   Scapula Adduction With Pectoralis Stretch: Mid-Range - Standing   Shoulders at 90 elbows even with shoulders, keeping weight through legs, shift weight forward until you feel pull or strength through the front of your chest. Hold __30_ seconds. Do _3__ times, __2-4_ times per day.   Scapula Adduction With Pectoralis Stretch: High - Standing   Shoulders at 120 hands up high on the doorway, keeping weight on feet, shift weight forward until you feel pull or stretch through the front of your chest. Hold _30__ seconds. Do _3__ times, _2-3__ times per day.  TENS UNIT: This is helpful for muscle pain and spasm.   Search and Purchase a TENS 7000 2nd edition at www.tenspros.com. It should be less than $30.     TENS unit instructions: Do not shower or bathe with the unit on Turn the unit off before removing electrodes or batteries If the  electrodes lose stickiness add a drop of water to the electrodes after they are disconnected from the unit and place on plastic sheet. If you continued to have difficulty, call the TENS unit company to purchase more electrodes. Do not apply lotion on the skin area prior to use. Make sure the skin is clean and dry as this will help prolong the life of the electrodes. After use, always check skin for unusual red areas, rash or other skin difficulties. If there are any skin problems, does not apply electrodes to the same area. Never remove the electrodes from the unit by pulling the wires. Do not use the TENS unit or electrodes other than as directed. Do not change electrode placement without consultating your therapist or physician. Keep 2 fingers with between each electrode.     [facility] [place], [place] [address]  [phone] (office) [phone] (fax)

## 2017-11-07 NOTE — Therapy (Signed)
[facility] [place], [place], [address] Phone: [phone]   Fax:  [phone]  Physical Therapy Evaluation  Patient Details  Name: Dennis Waller MRN: [id number] Date of Birth: 02/11/1951 Referring Provider: Dr Lynne Leader    Encounter Date: 11/07/2017  PT End of Session - 11/07/17 1258    Visit Number  1    Number of Visits  12    Date for PT Re-Evaluation  12/19/17    PT Start Time  0845    PT Stop Time  0944    PT Time Calculation (min)  59 min    Activity Tolerance  Patient tolerated treatment well       Past Medical History:  Diagnosis Date  . Chronic kidney disease    kidney stones  . Hyperlipidemia     Past Surgical History:  Procedure Laterality Date  . [place]   ? lumbar disc fusion  . KIDNEY STONE SURGERY      There were no vitals filed for this visit.   Subjective Assessment - 11/07/17 0850    Subjective  Patient reports that he had sudden onset of Lt side of the neck to the top of the Lt shoulder pain with no known cause of injury. Topical treatment has helped some but symptoms never go away.     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  Yes    Pain Score  4     Pain Location  Neck    Pain Orientation  Left    Pain Descriptors / Indicators  Spasm;Tightness;Aching    Pain Type  Acute pain    Pain Radiating Towards  Lt lateral cervical to upper trap and shoulder area     Pain Onset  1 to 4 weeks ago    Pain Frequency  Intermittent    Aggravating Factors   movement     Pain Relieving Factors  rest; essential oils; heating pad          [facility] PT Assessment - 11/07/17 0001      Assessment   Medical Diagnosis  Lt upper trap strain     Referring Provider  Dr Lynne Leader     Onset  Date/Surgical Date  10/25/17    Hand Dominance  Right    Next MD Visit  as needed     Prior Therapy  "upper cervical care" chiropractor every 6 weeks x 4 years       Precautions   Precautions  None      Balance Screen   Has the patient fallen in the past 6 months  No    Has the patient had a decrease in activity level because of a fear of falling?   No    Is the patient reluctant to leave their home because of a fear of falling?   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     Leisure  yard work; fishing; [place] at least 3 times/wk at least an hour  Observation/Other Assessments   Focus on Therapeutic Outcomes (FOTO)   35% limitation       Sensation   Additional Comments  WFL's per pt report       Posture/Postural Control   Posture Comments  head forward; shoudlers rounded and elevated; significant forward flexion at hips      AROM   Right/Left Shoulder  -- end range tightness with elevation     Cervical Flexion  63    Cervical Extension  28    Cervical - Right Side Bend  32    Cervical - Left Side Bend  21    Cervical - Right Rotation  62    Cervical - Left Rotation  54 painful and tight      Strength   Overall Strength Comments  5/5 bilat UE's       Palpation   Palpation comment  significant tightness through the pecs; ant/lat/post cervical musculature; upper trap; leveator; cervical and thoracic paraspinals Lt > Rt               No data recorded  Objective measurements completed on examination: See above findings.      [facility] Adult PT Treatment/Exercise - 11/07/17 0001      Self-Care   Self-Care  -- education re sitting posture/modification of recliner       Neuro Re-ed    Neuro Re-ed Details   working on posture and alignment in standing       Shoulder Exercises: Standing   Other Standing Exercises  axial extension 10 sec  x 5; scap squeeze 10 sec x 10; L's x 10; W's x 10 with swim noodle       Shoulder Exercises: Stretch   Other Shoulder Stretches  3 way doorway stretch 20-30 sec x 1 reps (did not go to highest position)      Moist Heat Therapy   Number Minutes Moist Heat  20 Minutes    Moist Heat Location  Shoulder;Cervical Lt       Electrical Stimulation   Electrical Stimulation Location  Lt lateral cervical/upper trap     Electrical Stimulation Action  IFC    Electrical Stimulation Parameters  to tolerance    Electrical Stimulation Goals  Tone;Pain             PT Education - 11/07/17 [id number]    Education provided  Yes    Education Details  HEP TENS     Person(s) Educated  Patient    Methods  Explanation;Demonstration;Tactile cues;Verbal cues;Handout    Comprehension  Verbalized understanding;Returned demonstration;Verbal cues required;Tactile cues required          PT Long Term Goals - 11/07/17 1303      PT LONG TERM GOAL #1   Title  Improve posture and alignment with patient to demonstrate more upright posture with posterior shoudler girdle engaged 12/19/17    Time  6    Period  Weeks    Status  New      PT LONG TERM GOAL #2   Title  Decrease pain in the Lt upper trap/cervical area with patient to report 50-75% improvement with ability to return to normal functional activities 12/19/17    Time  6    Period  Weeks    Status  New      PT LONG TERM GOAL #3   Title  Increase cervical ROM by 5-10 degrees in lateral flexion and rotation 12/19/17    Time  6  Period  Weeks    Status  New      PT LONG TERM GOAL #4   Title  Independent in HEP 12/19/17    Time  6    Period  Weeks    Status  New      PT LONG TERM GOAL #5   Title  Improve FOTO to </= 35% limitation 12/19/17    Time  6    Period  Weeks    Status  New             Plan - 11/07/17 1259    Clinical Impression Statement  Dennis Waller presents with 2-3 week history of Lt cervical/upper trap pain and tightness. He has poor  posture and alignment; limited ROM; muscular tightness to palpation and pain limiting functional activities. Patient will benefit from PT to address problems identified.     History and Personal Factors relevant to plan of care:  chronic LBP; severe arthritic changes through spine    Clinical Presentation  Evolving    Clinical Presentation due to:  postural changes; muscular imbalance; compensatory movement patterns     Clinical Decision Making  Moderate    Rehab Potential  Good    PT Frequency  2x / week    PT Duration  6 weeks    PT Treatment/Interventions  Patient/family education;ADLs/Self Care Home Management;Cryotherapy;Electrical Stimulation;Iontophoresis 4mg /ml Dexamethasone;Moist Heat;Ultrasound;Dry needling;Manual techniques;Neuromuscular re-education;Therapeutic activities;Therapeutic exercise    PT Next Visit Plan  review HEP; work on posture and alignment; trial of manual work through the [employer] upper quarter; trial of supine neural mobilization and snow angel prolonged stretch; modalities as indicated     Consulted and Agree with Plan of Care  Patient       Patient will benefit from skilled therapeutic intervention in order to improve the following deficits and impairments:  Postural dysfunction, Improper body mechanics, Pain, Increased fascial restricitons, Increased muscle spasms, Hypomobility, Decreased range of motion, Decreased mobility, Decreased activity tolerance  Visit Diagnosis: Other symptoms and signs involving the musculoskeletal system - Plan: PT plan of care cert/re-cert  Abnormal posture - Plan: PT plan of care cert/re-cert     Problem List Patient Active Problem List   Diagnosis Date Noted  . ED (erectile dysfunction) 10/17/2015  . Ear ringing 10/17/2015  . Cataract 02/18/2012  . GERD (gastroesophageal reflux disease) 02/18/2012  . SCIATICA 06/22/2010  . Hyperlipidemia 05/24/2008  . MICROSCOPIC HEMATURIA 05/24/2008  . URINARY URGENCY 05/24/2008    Bartlett Enke  Nilda Simmer PT, MPH  11/07/2017, 1:14 PM  [facility] [place], [place], [address] Phone: [phone]   Fax:  [phone]  Name: Dennis Waller MRN: [id number] Date of Birth: March 15, 1951

## 2017-11-12 ENCOUNTER — Encounter: Payer: Self-pay | Admitting: Rehabilitative and Restorative Service Providers"

## 2017-11-12 ENCOUNTER — Ambulatory Visit: Payer: Medicare Other | Admitting: Rehabilitative and Restorative Service Providers"

## 2017-11-12 DIAGNOSIS — R293 Abnormal posture: Secondary | ICD-10-CM | POA: Diagnosis not present

## 2017-11-12 DIAGNOSIS — R29898 Other symptoms and signs involving the musculoskeletal system: Secondary | ICD-10-CM

## 2017-11-12 NOTE — Therapy (Signed)
[facility] [place], [place], [address] Phone: [phone]   Fax:  [phone]  Physical Therapy Treatment  Patient Details  Name: Dennis Waller MRN: [id number] Date of Birth: September 06, 1950 Referring Provider: Dr Lynne Leader    Encounter Date: 11/12/2017  PT End of Session - 11/12/17 0918    Visit Number  2    Number of Visits  12    Date for PT Re-Evaluation  12/19/17    PT Start Time  0919    PT Stop Time  1020    PT Time Calculation (min)  61 min    Activity Tolerance  Patient tolerated treatment well       Past Medical History:  Diagnosis Date  . Chronic kidney disease    kidney stones  . Hyperlipidemia     Past Surgical History:  Procedure Laterality Date  . [place]   ? lumbar disc fusion  . KIDNEY STONE SURGERY      There were no vitals filed for this visit.  Subjective Assessment - 11/12/17 0920    Subjective  Patient reports that he has noticed some improvement. He is sleeping much better. He has some stiffness and soreness in the low back where he has the most arthritis. He is working on the exercises at home. Spasms in the neck are much less intense. He has more spasms when he is tired. He will feel the tingling sensation in the evening.     Currently in Pain?  Yes    Pain Score  3     Pain Location  Neck    Pain Orientation  Left    Pain Descriptors / Indicators  Tightness;Aching;Spasm    Pain Type  Acute pain         [facility] PT Assessment - 11/12/17 0001      Assessment   Medical Diagnosis  Lt upper trap strain     Referring Provider  Dr Lynne Leader     Onset Date/Surgical Date  10/25/17    Hand Dominance  Right    Next MD Visit  as needed     Prior Therapy  "upper cervical care" chiropractor every 6 weeks x 4 years       Posture/Postural Control   Posture Comments  head forward; shoudlers rounded and elevated; significant forward flexion at hips      Palpation    Palpation comment  persistent tightness through the pecs; ant/lat/post cervical musculature; upper trap; leveator; cervical and thoracic paraspinals Lt > Rt                    [facility] Adult PT Treatment/Exercise - 11/12/17 0001      Shoulder Exercises: Supine   Other Supine Exercises  axial extension 10 sec x 5; thoracic lift 10 sec x 5      Shoulder Exercises: Standing   Other Standing Exercises  axial extension 10 sec x 5; scap squeeze 10 sec x 10; L's x 10; W's x 10 with swim noodle       Shoulder Exercises: Stretch   Other Shoulder Stretches  3 way doorway stretch 20-30 sec x 1 reps (did not go to highest position)    Other Shoulder Stretches  prolonged snow angel ~ 2-3 min; neural stretch 1 min x 2 reps Lt UE and 1 min x 1 rep Rt UE       Moist Heat Therapy   Number Minutes  Moist Heat  20 Minutes    Moist Heat Location  Shoulder;Cervical Lt       Electrical Stimulation   Electrical Stimulation Location  Lt lateral cervical/upper trap     Electrical Stimulation Action  IFC    Electrical Stimulation Parameters  to tolerance    Electrical Stimulation Goals  Tone;Pain      Manual Therapy   Manual therapy comments  pt supine     Joint Mobilization  upper thoracic and cervical CPA mobs Grade II/III    Soft tissue mobilization  deep tissue work through Arts development officer; ant/lat/posterior cervical musculature; upper trap; leveator Lt > Rt but working bilat     Myofascial Release  pecs/anterior chest    Scapular Mobilization  Lt scap    Manual Traction  traction through the long arm bilat UE's 20-30 sec hold x 2-3 reps              PT Education - 11/12/17 0949    Education provided  Yes    Education Details  HEP     Person(s) Educated  Patient    Methods  Explanation;Demonstration;Tactile cues;Verbal cues;Handout    Comprehension  Verbalized understanding;Returned demonstration;Verbal cues required;Tactile cues required          PT Long Term Goals - 11/12/17 0919       PT LONG TERM GOAL #1   Title  Improve posture and alignment with patient to demonstrate more upright posture with posterior shoudler girdle engaged 12/19/17    Time  6    Period  Weeks    Status  On-going      PT LONG TERM GOAL #2   Title  Decrease pain in the Lt upper trap/cervical area with patient to report 50-75% improvement with ability to return to normal functional activities 12/19/17    Time  6    Period  Weeks    Status  On-going      PT LONG TERM GOAL #3   Title  Increase cervical ROM by 5-10 degrees in lateral flexion and rotation 12/19/17    Time  6    Period  Weeks    Status  On-going      PT LONG TERM GOAL #4   Title  Independent in HEP 12/19/17    Time  6    Period  Weeks    Status  On-going      PT LONG TERM GOAL #5   Title  Improve FOTO to </= 35% limitation 12/19/17    Time  6    Period  Weeks    Status  On-going            Plan - 11/12/17 0933    Clinical Impression Statement  Dennis Waller repors and demonstrates improvement in symptoms with less pain and some improvement in posture and alignment - continues to demonstrate forward posture through the trunk and head/shoulders. Good response to manual work.  No goals accomplished - second visit only.     Rehab Potential  Good    PT Frequency  2x / week    PT Duration  6 weeks    PT Treatment/Interventions  Patient/family education;ADLs/Self Care Home Management;Cryotherapy;Electrical Stimulation;Iontophoresis 4mg /ml Dexamethasone;Moist Heat;Ultrasound;Dry needling;Manual techniques;Neuromuscular re-education;Therapeutic activities;Therapeutic exercise    PT Next Visit Plan  review HEP; work on posture and alignment; assess response to manual work through the Lt upper quarter; assess response to supine neural mobilization and snow angel prolonged stretch; modalities as indicated     Consulted  and Agree with Plan of Care  Patient       Patient will benefit from skilled therapeutic intervention in order to improve the  following deficits and impairments:  Postural dysfunction, Improper body mechanics, Pain, Increased fascial restricitons, Increased muscle spasms, Hypomobility, Decreased range of motion, Decreased mobility, Decreased activity tolerance  Visit Diagnosis: Other symptoms and signs involving the musculoskeletal system  Abnormal posture     Problem List Patient Active Problem List   Diagnosis Date Noted  . ED (erectile dysfunction) 10/17/2015  . Ear ringing 10/17/2015  . Cataract 02/18/2012  . GERD (gastroesophageal reflux disease) 02/18/2012  . SCIATICA 06/22/2010  . Hyperlipidemia 05/24/2008  . MICROSCOPIC HEMATURIA 05/24/2008  . URINARY URGENCY 05/24/2008    Serena Petterson Nilda Simmer PT, MPH  11/12/2017, 10:18 AM  [facility] [place], [place], [address] Phone: [phone]   Fax:  [phone]  Name: Dennis Waller MRN: [id number] Date of Birth: 1950/12/22

## 2017-11-12 NOTE — Patient Instructions (Addendum)
Extensors, Supine    Lie supine, head on pillow at comfortable height. Gently tuck chin and bring toward chest. Hold _10__ seconds. Repeat _5__ times per session. Do _2__ sessions per day.  Thoracic Lift    Press shoulders down. Then lift mid-thoracic spine (area between the shoulder blades). Lift the breastbone slightly. Hold _10__ seconds. Relax. Repeat _10__ times.   Neurovascular: Median Nerve Glide With Cervical Bias - Supine    Lie with neck supported, right arm out to side, elbow straight, thumb down, fingers and wrist bent back. Slowly move opposite side ear toward shoulder as far as possible without pain. Hold 60 sec Repeat __2__ times per set. Do __2__ sessions per day  SUPINE Tips A    Being in the supine position means to be lying on the back. Lying on the back is the position of least compression on the bones and discs of the spine, and helps to re-align the natural curves of the back. Gradually bring arms closer to your ears - hold 2-3 minutes working toward 5 min for prolonged stretch

## 2017-11-14 ENCOUNTER — Encounter: Payer: Self-pay | Admitting: Rehabilitative and Restorative Service Providers"

## 2017-11-14 ENCOUNTER — Ambulatory Visit: Payer: Medicare Other | Admitting: Rehabilitative and Restorative Service Providers"

## 2017-11-14 DIAGNOSIS — R293 Abnormal posture: Secondary | ICD-10-CM | POA: Diagnosis not present

## 2017-11-14 DIAGNOSIS — R29898 Other symptoms and signs involving the musculoskeletal system: Secondary | ICD-10-CM | POA: Diagnosis not present

## 2017-11-14 NOTE — Therapy (Addendum)
[place], [place], [address] Phone: [phone]   Fax:  [phone]  Physical Therapy Treatment  Patient Details  Name: Dennis Waller MRN: [id number] Date of Birth: 1951/06/17 Referring Provider: Dr Lynne Leader    Encounter Date: 11/14/2017  PT End of Session - 11/14/17 1017    Visit Number  3    Number of Visits  12    Date for PT Re-Evaluation  12/19/17    PT Start Time  3888    PT Stop Time  1112    PT Time Calculation (min)  57 min    Activity Tolerance  Patient tolerated treatment well       Past Medical History:  Diagnosis Date  . Chronic kidney disease    kidney stones  . Hyperlipidemia     Past Surgical History:  Procedure Laterality Date  . [place]   ? lumbar disc fusion  . KIDNEY STONE SURGERY      There were no vitals filed for this visit.  Subjective Assessment - 11/14/17 1018    Subjective  Continues to improve. No pain today. Has used a TENS unit he borrowed from a friend. Working on his exercises and is pleased with his progress.     Currently in Pain?  No/denies                       [facility] Adult PT Treatment/Exercise - 11/14/17 0001      Shoulder Exercises: Supine   Other Supine Exercises  axial extension 10 sec x 5; thoracic lift 10 sec x 5      Shoulder Exercises: Standing   Extension  Strengthening;Right;Left;10 reps;Theraband    Theraband Level (Shoulder Extension)  Level 1 (Yellow)    Row  Strengthening;Right;Left;10 reps;Theraband    Theraband Level (Shoulder Row)  Level 1 (Yellow)    Retraction  Strengthening;Right;Left;10 reps;Theraband    Theraband Level (Shoulder Retraction)  Level 1 (Yellow)    Other Standing Exercises  axial extension 10 sec x 5; scap squeeze 10 sec x 10; L's x 10; W's x 10 with swim noodle       Shoulder Exercises: Stretch   Other Shoulder Stretches  3 way doorway stretch 20-30 sec x 1 reps (did not go  to highest position)    Other Shoulder Stretches  prolonged snow angel ~ 2-3 min; neural stretch 1 min x 2 reps Lt UE and 1 min x 1 rep Rt UE       Moist Heat Therapy   Number Minutes Moist Heat  20 Minutes    Moist Heat Location  Shoulder;Cervical Lt       Electrical Stimulation   Electrical Stimulation Location  Lt lateral cervical/upper trap     Electrical Stimulation Action  IFC    Electrical Stimulation Parameters  to tolerance    Electrical Stimulation Goals  Tone;Pain      Manual Therapy   Manual therapy comments  pt supine     Joint Mobilization  upper thoracic and cervical CPA mobs Grade II/III    Soft tissue mobilization  deep tissue work through Arts development officer; ant/lat/posterior cervical musculature; upper trap; leveator Lt > Rt but working bilat     Myofascial Release  pecs/anterior chest    Scapular Mobilization  Lt scap    Manual Traction  traction through the long arm bilat UE's 20-30 sec hold x 2-3 reps  PT Education - 11/14/17 1034    Education provided  Yes    Education Details  HEP     Person(s) Educated  Patient    Methods  Explanation;Demonstration;Tactile cues;Verbal cues;Handout    Comprehension  Verbalized understanding;Returned demonstration;Verbal cues required;Tactile cues required          PT Long Term Goals - 11/12/17 0919      PT LONG TERM GOAL #1   Title  Improve posture and alignment with patient to demonstrate more upright posture with posterior shoudler girdle engaged 12/19/17    Time  6    Period  Weeks    Status  On-going      PT LONG TERM GOAL #2   Title  Decrease pain in the Lt upper trap/cervical area with patient to report 50-75% improvement with ability to return to normal functional activities 12/19/17    Time  6    Period  Weeks    Status  On-going      PT LONG TERM GOAL #3   Title  Increase cervical ROM by 5-10 degrees in lateral flexion and rotation 12/19/17    Time  6    Period  Weeks    Status  On-going      PT  LONG TERM GOAL #4   Title  Independent in HEP 12/19/17    Time  6    Period  Weeks    Status  On-going      PT LONG TERM GOAL #5   Title  Improve FOTO to </= 35% limitation 12/19/17    Time  6    Period  Weeks    Status  On-going            Plan - 11/14/17 1020    Clinical Impression Statement  Excellent response to initial 2 treatments. Progressing with less pain and improving posture and mobility. Progressing well toward stated goals of therapy.     Rehab Potential  Good    PT Frequency  2x / week    PT Duration  6 weeks    PT Treatment/Interventions  Patient/family education;ADLs/Self Care Home Management;Cryotherapy;Electrical Stimulation;Iontophoresis 57m/ml Dexamethasone;Moist Heat;Ultrasound;Dry needling;Manual techniques;Neuromuscular re-education;Therapeutic activities;Therapeutic exercise    PT Next Visit Plan  review HEP; work on posture and alignment; continue manual work through the [employer]; assess response to supine neural mobilization and snow angel prolonged stretch; modalities as indicated     Consulted and Agree with Plan of Care  Patient       Patient will benefit from skilled therapeutic intervention in order to improve the following deficits and impairments:  Postural dysfunction, Improper body mechanics, Pain, Increased fascial restricitons, Increased muscle spasms, Hypomobility, Decreased range of motion, Decreased mobility, Decreased activity tolerance  Visit Diagnosis: Other symptoms and signs involving the musculoskeletal system  Abnormal posture     Problem List Patient Active Problem List   Diagnosis Date Noted  . ED (erectile dysfunction) 10/17/2015  . Ear ringing 10/17/2015  . Cataract 02/18/2012  . GERD (gastroesophageal reflux disease) 02/18/2012  . SCIATICA 06/22/2010  . Hyperlipidemia 05/24/2008  . MICROSCOPIC HEMATURIA 05/24/2008  . URINARY URGENCY 05/24/2008    Linnie Delgrande PNilda SimmerPT, MPH 11/14/2017, 10:39 AM  [facility] [address]Phone: [phone]  Fax:  [phone] Name: Dennis DOMANSKIMRN: [id number] of Birth: [birth year] PHYSICAL THERAPY DISCHARGE SUMMARY  Visits from Start of Care: 3  Current functional  level related to goals / functional outcomes: See last progress note for discharge status   Remaining deficits: Unknown    Education / Equipment: HEP Plan: Patient agrees to discharge.  Patient goals were partially met. Patient is being discharged due to being pleased with the current functional level.  ?????    Keymari Sato P. Helene Kelp PT, MPH 12/03/17 10:04 AM

## 2017-11-14 NOTE — Patient Instructions (Signed)
Resisted External Rotation: in Neutral - Bilateral   PALMS UP Sit or stand, tubing in both hands, elbows at sides, bent to 90, forearms forward. Pinch shoulder blades together and rotate forearms out. Keep elbows at sides. Repeat __10__ times per set. Do _2-3___ sets per session. Do _2-3___ sessions per day.   Low Row: Standing   Face anchor, feet shoulder width apart. Palms up, pull arms back, squeezing shoulder blades together. Repeat 10__ times per set. Do 2-3__ sets per session. Do 1-2__ sessions per week. Anchor Height: Waist    Strengthening: Resisted Extension   Hold tubing in right hand, arm forward. Pull arm back, elbow straight. Repeat _10___ times per set. Do 2-3____ sets per session. Do 1-2___ sessions per day.

## 2018-01-22 ENCOUNTER — Other Ambulatory Visit: Payer: Self-pay | Admitting: Family Medicine

## 2018-04-24 ENCOUNTER — Other Ambulatory Visit: Payer: Self-pay | Admitting: Family Medicine

## 2018-05-06 ENCOUNTER — Ambulatory Visit: Payer: Medicare Other

## 2018-05-06 DIAGNOSIS — Z87891 Personal history of nicotine dependence: Secondary | ICD-10-CM | POA: Diagnosis not present

## 2018-05-06 DIAGNOSIS — Z122 Encounter for screening for malignant neoplasm of respiratory organs: Secondary | ICD-10-CM

## 2018-05-09 ENCOUNTER — Other Ambulatory Visit: Payer: Self-pay | Admitting: Acute Care

## 2018-05-09 DIAGNOSIS — Z122 Encounter for screening for malignant neoplasm of respiratory organs: Secondary | ICD-10-CM

## 2018-05-09 DIAGNOSIS — Z87891 Personal history of nicotine dependence: Secondary | ICD-10-CM

## 2018-05-21 NOTE — Progress Notes (Signed)
Subjective:   Dennis Waller is a 67 y.o. male who presents for Medicare Annual/Subsequent preventive examination.  Review of Systems:  No ROS.  Medicare Wellness Visit. Additional risk factors are reflected in the social history.  Cardiac Risk Factors include: dyslipidemia Sleep patterns:   Gets around 8 hours of sleep at night. Wakes up feels rested. Does not get up during night to urinate Home Safety/Smoke Alarms: Feels safe in home. Smoke alarms in place.  Living environment; Lives with wife in 1 story home. Shower is walk in shower with grab bars in place. .    Male:   CCS- utd    PSA- 2017 Lab Results  Component Value Date   PSA 2.9 04/17/2016   PSA 3.30 02/12/2013   PSA 1.83 05/25/2008       Objective:    Vitals: BP 121/64   Pulse 76   Ht 5\' 10"  (1.778 m)   Wt 196 lb (88.9 kg)   SpO2 95%   BMI 28.12 kg/m   Body mass index is 28.12 kg/m.  Advanced Directives 05/28/2018 06/11/2014  Does Patient Have a Medical Advance Directive? Yes No  Type of Paramedic of [place];Out of facility DNR (pink MOST or yellow form);Living will -  Does patient want to make changes to medical advance directive? No - Patient declined -  Copy of [place] in Chart? No - copy requested -  Would patient like information on creating a medical advance directive? - No - patient declined information    Tobacco Social History   Tobacco Use  Smoking Status Former Smoker  . Packs/day: 1.00  . Years: 55.00  . Pack years: 55.00  . Types: Cigarettes  . Last attempt to quit: 11/09/2015  . Years since quitting: 2.5  Smokeless Tobacco Never Used     Counseling given: Not Answered   Clinical Intake:  Pre-visit preparation completed: Yes  Pain : No/denies pain     Nutritional Risks: None Diabetes: No  How often do you need to have someone help you when you read instructions, pamphlets, or other written materials from your doctor or  pharmacy?: 1 - Never What is the last grade level you completed in school?: 12  Interpreter Needed?: No  Information entered by :: Dennis Dakin, LPN  Past Medical History:  Diagnosis Date  . Chronic kidney disease    kidney stones  . Hyperlipidemia    Past Surgical History:  Procedure Laterality Date  . [place]   ? lumbar disc fusion  . KIDNEY STONE SURGERY     Family History  Problem Relation Age of Onset  . Hyperlipidemia Father   . Hypertension Father   . Diabetes Mother   . Hypertension Mother   . Cancer Mother        colon  . COPD Mother    Social History   Socioeconomic History  . Marital status: Married    Spouse name: Dennis Waller  . Number of children: 3  . Years of education: 93  . Highest education level: 12th grade  Occupational History  . Occupation: retired    Comment: Location manager  Social Needs  . Financial resource strain: Not hard at all  . Food insecurity:    Worry: Never true    Inability: Never true  . Transportation needs:    Medical: No    Non-medical: No  Tobacco Use  . Smoking status: Former Smoker  Packs/day: 1.00    Years: 55.00    Pack years: 55.00    Types: Cigarettes    Last attempt to quit: 11/09/2015    Years since quitting: 2.5  . Smokeless tobacco: Never Used  Substance and Sexual Activity  . Alcohol use: Yes    Comment: social drinker  . Drug use: No  . Sexual activity: Not Currently  Lifestyle  . Physical activity:    Days per week: 0 days    Minutes per session: 0 min  . Stress: Not at all  Relationships  . Social connections:    Talks on phone: Twice a week    Gets together: Twice a week    Attends religious service: More than 4 times per year    Active member of club or organization: No    Attends meetings of clubs or organizations: Never    Relationship status: Married  Other Topics Concern  . Not on file  Social History Narrative   Water quality scientist at [employer]. Wants to get back in exercise  routine. Enjoys playing his trombone.    Outpatient Encounter Medications as of 05/28/2018  Medication Sig  . atorvastatin (LIPITOR) 40 MG tablet Take 1 tablet (40 mg total) by mouth daily.  . ranitidine (ZANTAC) 75 MG tablet Take 75 mg by mouth 2 (two) times daily.    . [DISCONTINUED] cyclobenzaprine (FLEXERIL) 10 MG tablet Take 1 tablet (10 mg total) by mouth 3 (three) times daily as needed for muscle spasms. (Patient not taking: Reported on 05/28/2018)  . [DISCONTINUED] Multiple Vitamins-Minerals (OCUVITE ADULT 50+ PO) Take 1 tablet by mouth daily.   No facility-administered encounter medications on file as of 05/28/2018.     Activities of Daily Living In your present state of health, do you have any difficulty performing the following activities: 05/28/2018  Hearing? Y  Comment has tinnitus in both ears from [place] airport  Vision? N  Difficulty concentrating or making decisions? N  Walking or climbing stairs? N  Dressing or bathing? N  Doing errands, shopping? N  Preparing Food and eating ? N  Using the Toilet? N  In the past six months, have you accidently leaked urine? N  Do you have problems with loss of bowel control? N  Managing your Medications? N  Managing your Finances? N  Housekeeping or managing your Housekeeping? N  Some recent data might be hidden    Patient Care Team: Hali Marry, MD as PCP - General   Assessment:   This is a routine wellness examination for Dennis Waller. Physical assessment deferred to PCP.   Exercise Activities and Dietary recommendations Current Exercise Habits: The patient does not participate in regular exercise at present, Exercise limited by: None identified Diet-healthy eating with vegetables and fruits Breakfast: fruit smoothie Lunch: sandwich or salad Dinner: meat and a vegetable. Eats fish a lot. Drinking at least 28 to 32 ounces of water a day.      Goals    . Exercise 150 min/wk Moderate Activity      Start back working out in gym for this year       Fall Risk Fall Risk  05/28/2018 07/11/2017 10/17/2015 06/11/2014  Falls in the past year? No No Yes No  Number falls in past yr: - - 2 or more -  Injury with Fall? - - No -  Follow up - - Education provided -   Is the patient's home free of loose throw rugs in walkways, pet  beds, electrical cords, etc?   yes      Grab bars in the bathroom? yes      Handrails on the stairs?   yes      Adequate lighting?   yes   Depression Screen PHQ 2/9 Scores 05/28/2018 06/12/2017 10/17/2015 06/11/2014  PHQ - 2 Score 0 0 0 0    Cognitive Function     6CIT Screen 05/28/2018  What Year? 0 points  What month? 0 points  What time? 0 points  Count back from 20 0 points  Months in reverse 0 points  Repeat phrase 0 points  Total Score 0    Immunization History  Administered Date(s) Administered  . Influenza, High Dose Seasonal PF 06/12/2017  . Influenza,inj,Quad PF,6+ Mos 07/02/2013, 06/11/2014, 10/17/2015, 04/17/2016  . Pneumococcal Conjugate-13 06/11/2014  . Pneumococcal Polysaccharide-23 08/13/2004, 10/17/2015  . Tdap 03/09/2011  . Zoster 03/09/2011     Screening Tests Health Maintenance  Topic Date Due  . INFLUENZA VACCINE  03/13/2018  . TETANUS/TDAP  03/08/2021  . COLONOSCOPY  04/22/2021  . Hepatitis C Screening  Completed  . PNA vac Low Risk Adult  Completed        Plan:  Please schedule your next medicare wellness visit with me in 1 yr.  Mr. Alpern , Thank you for taking time to come for your Medicare Wellness Visit. I appreciate your ongoing commitment to your health goals. Please review the following plan we discussed and let me know if I can assist you in the future.   These are the goals we discussed: Goals    . Exercise 150 min/wk Moderate Activity     Start back working out in gym for this year       This is a list of the screening recommended for you and due dates:  Health Maintenance  Topic Date Due  . Flu  Shot  03/13/2018  . Tetanus Vaccine  03/08/2021  . Colon Cancer Screening  04/22/2021  .  Hepatitis C: One time screening is recommended by Center for Disease Control  (CDC) for  adults born from [birth year] through 1967.   Completed  . Pneumonia vaccines  Completed   Continue doing brain stimulating activities (puzzles, reading, adult coloring books, staying active) to keep memory sharp.    These are the goals we discussed: Goals    . Exercise 150 min/wk Moderate Activity     Start back working out in gym for this year       This is a list of the screening recommended for you and due dates:  Health Maintenance  Topic Date Due  . Flu Shot  03/13/2018  . Tetanus Vaccine  03/08/2021  . Colon Cancer Screening  04/22/2021  .  Hepatitis C: One time screening is recommended by Center for Disease Control  (CDC) for  adults born from [birth year] through 1967.   Completed  . Pneumonia vaccines  Completed     These are the goals we discussed: Goals    . Exercise 150 min/wk Moderate Activity     Start back working out in gym for this year       This is a list of the screening recommended for you and due dates:  Health Maintenance  Topic Date Due  . Flu Shot  03/13/2018  . Tetanus Vaccine  03/08/2021  . Colon Cancer Screening  04/22/2021  .  Hepatitis C: One time screening is recommended by Center for Disease Control  (CDC) for  adults born from [birth year] through 1967.   Completed  . Pneumonia vaccines  Completed     I have personally reviewed and noted the following in the patient's chart:   . Medical and social history . Use of alcohol, tobacco or illicit drugs  . Current medications and supplements . Functional ability and status . Nutritional status . Physical activity . Advanced directives . List of other physicians . Hospitalizations, surgeries, and ER visits in previous 12 months . Vitals . Screenings to include cognitive, depression, and falls . Referrals and appointments  In  addition, I have reviewed and discussed with patient certain preventive protocols, quality metrics, and best practice recommendations. A written personalized care plan for preventive services as well as general preventive health recommendations were provided to patient.     Dennis Chars, LPN  24/40/1027

## 2018-05-28 ENCOUNTER — Ambulatory Visit (INDEPENDENT_AMBULATORY_CARE_PROVIDER_SITE_OTHER): Payer: Medicare Other | Admitting: *Deleted

## 2018-05-28 VITALS — BP 121/64 | HR 76 | Ht 70.0 in | Wt 196.0 lb

## 2018-05-28 DIAGNOSIS — Z23 Encounter for immunization: Secondary | ICD-10-CM

## 2018-05-28 DIAGNOSIS — Z0000 Encounter for general adult medical examination without abnormal findings: Secondary | ICD-10-CM

## 2018-05-28 NOTE — Patient Instructions (Signed)
Please schedule your next medicare wellness visit with me in 1 yr.  Dennis Waller , Thank you for taking time to come for your Medicare Wellness Visit. I appreciate your ongoing commitment to your health goals. Please review the following plan we discussed and let me know if I can assist you in the future.   These are the goals we discussed: Goals    . Exercise 150 min/wk Moderate Activity     Start back working out in gym for this year       This is a list of the screening recommended for you and due dates:  Health Maintenance  Topic Date Due  . Flu Shot  03/13/2018  . Tetanus Vaccine  03/08/2021  . Colon Cancer Screening  04/22/2021  .  Hepatitis C: One time screening is recommended by Center for Disease Control  (CDC) for  adults born from [birth year] through 1967.   Completed  . Pneumonia vaccines  Completed   Continue doing brain stimulating activities (puzzles, reading, adult coloring books, staying active) to keep memory sharp.

## 2018-07-22 ENCOUNTER — Other Ambulatory Visit: Payer: Self-pay | Admitting: Family Medicine

## 2018-09-08 ENCOUNTER — Other Ambulatory Visit: Payer: Self-pay | Admitting: Family Medicine

## 2018-11-09 ENCOUNTER — Other Ambulatory Visit: Payer: Self-pay | Admitting: Family Medicine

## 2018-12-18 ENCOUNTER — Ambulatory Visit (INDEPENDENT_AMBULATORY_CARE_PROVIDER_SITE_OTHER): Payer: Medicare Other | Admitting: Family Medicine

## 2018-12-18 ENCOUNTER — Encounter: Payer: Self-pay | Admitting: Family Medicine

## 2018-12-18 VITALS — Temp 96.4°F | Ht 70.0 in

## 2018-12-18 DIAGNOSIS — R509 Fever, unspecified: Secondary | ICD-10-CM

## 2018-12-18 NOTE — Progress Notes (Signed)
Virtual Visit via Video Note  I connected with Dennis Waller on 12/18/18 at 10:10 AM EDT by a video enabled telemedicine application and verified that I am speaking with the correct person using two identifiers.   I discussed the limitations of evaluation and management by telemedicine and the availability of in person appointments. The patient expressed understanding and agreed to proceed.  Pt was at home and I was in my office for the virtual visit.     Subjective:    CC: Fever.   HPI: sxs began 3 nights ago chills, sweats. Over the past 2 days his temp a has been 100.0. this morning around 5-530 AM his took his temp it was 100 he took Tylenol and Advil this morning at the same time. No known sick contacts.  Denies n/v/d. Since these episodes began he has had headaches. He said they are at the back of his head. He reports his pain as being dull, 5/10. No visual changes, he said that once the fever breaks and the fever goes away the headache goes away. No cough or SOB.   He has low energy, and some body aches. No urinary sxs. No wounds.  No ST. No diarrhea.  Wife had vomiting and diarrhea that only last 12 hours and then resolved. Occurred 2 days ago.  She didn't have a fever.  No nasal congestion. No upper back pain.  Felt a little better yesterday until the fever came back.   He has been drinking plenty of fluids.     Past medical history, Surgical history, Family history not pertinant except as noted below, Social history, Allergies, and medications have been entered into the medical record, reviewed, and corrections made.   Review of Systems: No fevers, chills, night sweats, weight loss, chest pain, or shortness of breath.   Objective:    General: Speaking clearly in complete sentences without any shortness of breath.  Alert and oriented x3.  Normal judgment. No apparent acute distress. Well groomed, sitting at kitchen table.    Impression and Recommendations:    Fever of  unknown origin -unclear etiology.  He really has no respiratory symptoms on a systems, abdominal pain or urinary symptoms at all.  No wounds that are not healing.  He did feel a little bit better yesterday during the daytime until he ran a fever again around 5 PM.  So it may be that his fever is actually defervesced seen.  We discussed options including staying at home and continuing to self monitor for the next couple of days versus going ahead and initiating a work-up.  He felt pretty comfortable with giving it a couple more days and through the weekend.  I did ask him to call us if he started to develop any sore throat, respiratory symptoms myalgias, diarrhea etc.  Consider could be COVID, though again no specific respiratory symptoms but he could have a very mild version.  Encouraged him to stay at home.  Please call on Monday if still having fever daily.      I discussed the assessment and treatment plan with the patient. The patient was provided an opportunity to ask questions and all were answered. The patient agreed with the plan and demonstrated an understanding of the instructions.   The patient was advised to call back or seek an in-person evaluation if the symptoms worsen or if the condition fails to improve as anticipated.   Beatrice Lecher, MD

## 2018-12-18 NOTE — Progress Notes (Signed)
sxs began 3 nights ago chills, sweats. Over the past 2 days his temp a has been 100.0. this morning around 5-530 AM his took his temp it was 100 he took Tylenol and Advil this morning at the same time.   Asked if he has been around anyone that has been sick? He reports that he has not to his knowledge.  Denies n/v/d. Since these episodes began he has had headaches. He said they are at the back of his head. He reports his pain as being dull, 5/10. No visual changes, he said that once the fever breaks and the fever goes away the headache goes away.  He has low energy, and some body aches.  He has been drinking plenty of fluids.      Maryruth Eve, Lahoma Crocker, CMA

## 2018-12-30 ENCOUNTER — Ambulatory Visit (INDEPENDENT_AMBULATORY_CARE_PROVIDER_SITE_OTHER): Payer: Medicare Other | Admitting: Family Medicine

## 2018-12-30 ENCOUNTER — Encounter: Payer: Self-pay | Admitting: Family Medicine

## 2018-12-30 ENCOUNTER — Ambulatory Visit: Payer: Medicare Other | Admitting: Sports Medicine

## 2018-12-30 VITALS — BP 154/81 | Temp 98.4°F

## 2018-12-30 DIAGNOSIS — J340 Abscess, furuncle and carbuncle of nose: Secondary | ICD-10-CM

## 2018-12-30 DIAGNOSIS — R110 Nausea: Secondary | ICD-10-CM

## 2018-12-30 MED ORDER — SULFAMETHOXAZOLE-TRIMETHOPRIM 800-160 MG PO TABS: ORAL_TABLET | ORAL | 0 refills | Status: DC

## 2018-12-30 MED ORDER — ONDANSETRON HCL 4 MG PO TABS: 4.0000 mg | ORAL_TABLET | Freq: Three times a day (TID) | ORAL | 0 refills | Status: DC | PRN

## 2018-12-30 MED ORDER — MUPIROCIN 2 % EX OINT: TOPICAL_OINTMENT | CUTANEOUS | 0 refills | Status: DC

## 2018-12-30 MED ORDER — HYDROCODONE-ACETAMINOPHEN 5-325 MG PO TABS: 1.0000 | ORAL_TABLET | Freq: Four times a day (QID) | ORAL | 0 refills | Status: DC | PRN

## 2018-12-30 NOTE — Progress Notes (Signed)
Acute Office Visit  Subjective:    Patient ID: Dennis Waller, male    DOB: June 10, 1951, 68 y.o.   MRN: [id number]  No chief complaint on file.   HPI Patient is in today for abscess in his nose.  He says it started in his right nostril with what he thought was just an ingrown hair.  He noticed it probably about Wednesday of last week so approximately 6 days ago.  He says since then is just been gradually getting worse.  He says is to the point now where he is noticing some pustules on the outside of the right nostril and it just feels extremely sore to tender to touch.  It is painful enough that it is keeping him awake at night.  He is been alternating Tylenol and ibuprofen.  He did try some Neosporin ointment for a few days that his wife had been applying with a Q-tip.  He says he is getting so much pain now that he is actually feeling nauseated.  He has not had any fevers or chills but has felt sweaty today.  Rates his pain a 7 out of 10 today.  Past Medical History:  Diagnosis Date  . Chronic kidney disease    kidney stones  . Hyperlipidemia     Past Surgical History:  Procedure Laterality Date  . [place]   ? lumbar disc fusion  . KIDNEY STONE SURGERY      Family History  Problem Relation Age of Onset  . Hyperlipidemia Father   . Hypertension Father   . Diabetes Mother   . Hypertension Mother   . Cancer Mother        colon  . COPD Mother     Social History   Socioeconomic History  . Marital status: Married    Spouse name: diane  . Number of children: 3  . Years of education: 17  . Highest education level: 12th grade  Occupational History  . Occupation: retired    Comment: Location manager  Social Needs  . Financial resource strain: Not hard at all  . Food insecurity:    Worry: Never true    Inability: Never true  . Transportation needs:    Medical: No    Non-medical: No  Tobacco Use  . Smoking status: Former Smoker    Packs/day: 1.00     Years: 55.00    Pack years: 55.00    Types: Cigarettes    Last attempt to quit: 11/09/2015    Years since quitting: 3.1  . Smokeless tobacco: Never Used  Substance and Sexual Activity  . Alcohol use: Yes    Comment: social drinker  . Drug use: No  . Sexual activity: Not Currently  Lifestyle  . Physical activity:    Days per week: 0 days    Minutes per session: 0 min  . Stress: Not at all  Relationships  . Social connections:    Talks on phone: Twice a week    Gets together: Twice a week    Attends religious service: More than 4 times per year    Active member of club or organization: No    Attends meetings of clubs or organizations: Never    Relationship status: Married  . Intimate partner violence:    Fear of current or ex partner: No    Emotionally abused: No    Physically abused: No    Forced sexual activity: No  Other Topics Concern  .  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 Enjoys playing his trombone.    Outpatient Medications Prior to Visit  Medication Sig Dispense Refill  . atorvastatin (LIPITOR) 40 MG tablet Take 1 tablet (40 mg total) by mouth daily. LAST REFILL.APPOINTMENT REQUIRED FOR REFILLS 90 tablet 0  . Famotidine (PEPCID AC PO) Take by mouth.     No facility-administered medications prior to visit.     No Known Allergies  ROS     Objective:    Physical Exam  Constitutional: He is oriented to person, place, and time. He appears well-developed and well-nourished.  He did look sweaty on exam.  HENT:  Head: Normocephalic and atraumatic.  His entire nose looks erythematous and swollen.  Near the tip of the nose to the right he has a cluster of small pustules.  On the inside of the right nose he had some thick yellow crusting no active bleeding.  Extremely tender to touch.  He was very tender just lateral to the nasal bridge on the right side.  Less tender on the left side.  Eyes:  Conjunctivae and EOM are normal.  Cardiovascular: Normal rate, regular rhythm and normal heart sounds.  Pulmonary/Chest: Effort normal and breath sounds normal.  Neurological: He is alert and oriented to person, place, and time.  Skin: Skin is warm and dry. No pallor.  Psychiatric: He has a normal mood and affect. His behavior is normal.  Vitals reviewed.   BP (!) 154/81   Temp 98.4 F (36.9 C)   SpO2 97%  Wt Readings from Last 3 Encounters:  05/28/18 196 lb (88.9 kg)  10/30/17 196 lb (88.9 kg)  07/11/17 190 lb (86.2 kg)    There are no preventive care reminders to display for this patient.  There are no preventive care reminders to display for this patient.   No results found for: TSH Lab Results  Component Value Date   WBC 6.4 02/12/2013   HGB 12.4 (L) 02/12/2013   HCT 35.4 (L) 02/12/2013   MCV 91.9 02/12/2013   PLT 267 02/12/2013   Lab Results  Component Value Date   NA 137 04/17/2016   K 4.4 04/17/2016   CO2 24 04/17/2016   GLUCOSE 89 04/17/2016   BUN 19 04/17/2016   CREATININE 0.93 04/17/2016   BILITOT 0.3 04/17/2016   ALKPHOS 64 04/17/2016   AST 14 04/17/2016   ALT 13 04/17/2016   PROT 6.8 04/17/2016   ALBUMIN 4.6 04/17/2016   CALCIUM 9.2 04/17/2016   Lab Results  Component Value Date   CHOL 154 04/17/2016   Lab Results  Component Value Date   HDL 38 (L) 04/17/2016   Lab Results  Component Value Date   LDLCALC 43 04/17/2016   Lab Results  Component Value Date   TRIG 366 (H) 04/17/2016   Lab Results  Component Value Date   CHOLHDL 4.1 04/17/2016   Lab Results  Component Value Date   HGBA1C 5.5 02/12/2013       Assessment & Plan:   Problem List Items Addressed This Visit    None    Visit Diagnoses    Abscess of nose    -  Primary   Nausea         Abscess of the nose-I did not see anything specific that could be drained.  We will go ahead and start him on double dose Bactrim.  If after 5 days he  is doing well then okay to  decrease down to 1 tab twice a day.  I gave him a small amount of nausea medicine as well as pain medication for comfort.  Also added mupirocin ointment to be applied with a Q-tip on the inside of the nose and over the cluster of pustules on the outside of the nose.  If he is not improving over the next 48 hours and I want him to call me back so that we can get him in with ENT before the weekend.  Blood pressure is elevated today but likely secondary to pain and discomfort.  He was also talking about getting his blood pressure checked.  We will try to follow that up later once he is feeling better.   Meds ordered this encounter  Medications  . sulfamethoxazole-trimethoprim (BACTRIM DS) 800-160 MG tablet    Sig: 2 tab po BID x 5 days, then 1 tab po BID x 5 days.    Dispense:  30 tablet    Refill:  0  . HYDROcodone-acetaminophen (NORCO/VICODIN) 5-325 MG tablet    Sig: Take 1 tablet by mouth every 6 (six) hours as needed for moderate pain.    Dispense:  20 tablet    Refill:  0  . mupirocin ointment (BACTROBAN) 2 %    Sig: Apply to inside of each nares daily for 10 days    Dispense:  30 g    Refill:  0  . ondansetron (ZOFRAN) 4 MG tablet    Sig: Take 1 tablet (4 mg total) by mouth every 8 (eight) hours as needed for nausea or vomiting.    Dispense:  12 tablet    Refill:  0     Beatrice Lecher, MD

## 2019-02-17 ENCOUNTER — Other Ambulatory Visit: Payer: Self-pay | Admitting: *Deleted

## 2019-02-17 DIAGNOSIS — E785 Hyperlipidemia, unspecified: Secondary | ICD-10-CM

## 2019-02-17 DIAGNOSIS — R972 Elevated prostate specific antigen [PSA]: Secondary | ICD-10-CM

## 2019-02-17 DIAGNOSIS — Z125 Encounter for screening for malignant neoplasm of prostate: Secondary | ICD-10-CM

## 2019-02-17 MED ORDER — ATORVASTATIN CALCIUM 40 MG PO TABS: 40.0000 mg | ORAL_TABLET | Freq: Every day | ORAL | 3 refills | Status: DC

## 2019-02-19 ENCOUNTER — Other Ambulatory Visit: Payer: Self-pay

## 2019-02-19 ENCOUNTER — Ambulatory Visit (INDEPENDENT_AMBULATORY_CARE_PROVIDER_SITE_OTHER): Payer: Medicare Other | Admitting: Family Medicine

## 2019-02-19 ENCOUNTER — Encounter: Payer: Self-pay | Admitting: Family Medicine

## 2019-02-19 VITALS — BP 128/88 | HR 68 | Ht 70.0 in | Wt 197.0 lb

## 2019-02-19 DIAGNOSIS — M545 Low back pain, unspecified: Secondary | ICD-10-CM | POA: Insufficient documentation

## 2019-02-19 DIAGNOSIS — Z125 Encounter for screening for malignant neoplasm of prostate: Secondary | ICD-10-CM

## 2019-02-19 DIAGNOSIS — R030 Elevated blood-pressure reading, without diagnosis of hypertension: Secondary | ICD-10-CM

## 2019-02-19 DIAGNOSIS — E785 Hyperlipidemia, unspecified: Secondary | ICD-10-CM

## 2019-02-19 NOTE — Assessment & Plan Note (Signed)
He has started doing some yoga more recently I think that that actually would be really helpful.  Also gave him the name of you to physical therapist Mikki Santee and Leroy Sea to check out as well I think this can be really helpful to add into his routine.  Would always consider further work-up or referral to orthopedics if he would prefer.  He will let me know.

## 2019-02-19 NOTE — Assessment & Plan Note (Signed)
Continue current regimen.  Lipids are up-to-date.  Tolerating well.

## 2019-02-19 NOTE — Patient Instructions (Addendum)
Checkout Mikki Santee and Brad on YouTube for physical therapy for low back.  If you type in [place], low back it should come up.     DASH Eating Plan DASH stands for "Dietary Approaches to Stop Hypertension." The DASH eating plan is a healthy eating plan that has been shown to reduce high blood pressure (hypertension). It may also reduce your risk for type 2 diabetes, heart disease, and stroke. The DASH eating plan may also help with weight loss. What are tips for following this plan?  General guidelines  Avoid eating more than 2,300 mg (milligrams) of salt (sodium) a day. If you have hypertension, you may need to reduce your sodium intake to 1,500 mg a day.  Limit alcohol intake to no more than 1 drink a day for nonpregnant women and 2 drinks a day for men. One drink equals 12 oz of beer, 5 oz of wine, or 1 oz of hard liquor.  Work with your health care provider to maintain a healthy body weight or to lose weight. Ask what an ideal weight is for you.  Get at least 30 minutes of exercise that causes your heart to beat faster (aerobic exercise) most days of the week. Activities may include walking, swimming, or biking.  Work with your health care provider or diet and nutrition specialist (dietitian) to adjust your eating plan to your individual calorie needs. Reading food labels   Check food labels for the amount of sodium per serving. Choose foods with less than 5 percent of the Daily Value of sodium. Generally, foods with less than 300 mg of sodium per serving fit into this eating plan.  To find whole grains, look for the word "whole" as the first word in the ingredient list. Shopping  Buy products labeled as "low-sodium" or "no salt added."  Buy fresh foods. Avoid canned foods and premade or frozen meals. Cooking  Avoid adding salt when cooking. Use salt-free seasonings or herbs instead of table salt or sea salt. Check with your health care provider or pharmacist before using salt  substitutes.  Do not fry foods. Cook foods using healthy methods such as baking, boiling, grilling, and broiling instead.  Cook with heart-healthy oils, such as olive, canola, soybean, or sunflower oil. Meal planning  Eat a balanced diet that includes: ? 5 or more servings of fruits and vegetables each day. At each meal, try to fill half of your plate with fruits and vegetables. ? Up to 6-8 servings of whole grains each day. ? Less than 6 oz of lean meat, poultry, or fish each day. A 3-oz serving of meat is about the same size as a deck of cards. One egg equals 1 oz. ? 2 servings of low-fat dairy each day. ? A serving of nuts, seeds, or beans 5 times each week. ? Heart-healthy fats. Healthy fats called Omega-3 fatty acids are found in foods such as flaxseeds and coldwater fish, like sardines, salmon, and mackerel.  Limit how much you eat of the following: ? Canned or prepackaged foods. ? Food that is high in trans fat, such as fried foods. ? Food that is high in saturated fat, such as fatty meat. ? Sweets, desserts, sugary drinks, and other foods with added sugar. ? Full-fat dairy products.  Do not salt foods before eating.  Try to eat at least 2 vegetarian meals each week.  Eat more home-cooked food and less restaurant, buffet, and fast food.  When eating at a restaurant, ask that  your food be prepared with less salt or no salt, if possible. What foods are recommended? The items listed may not be a complete list. Talk with your dietitian about what dietary choices are best for you. Grains Whole-grain or whole-wheat bread. Whole-grain or whole-wheat pasta. Brown rice. [place]row. Bulgur. Whole-grain and low-sodium cereals. Pita bread. Low-fat, low-sodium crackers. Whole-wheat flour tortillas. Vegetables Fresh or frozen vegetables (raw, steamed, roasted, or grilled). Low-sodium or reduced-sodium tomato and vegetable juice. Low-sodium or reduced-sodium tomato sauce and tomato  paste. Low-sodium or reduced-sodium canned vegetables. Fruits All fresh, dried, or frozen fruit. Canned fruit in natural juice (without added sugar). Meat and other protein foods Skinless chicken or [place]. Ground chicken or [place]. Pork with fat trimmed off. Fish and seafood. Egg whites. Dried beans, peas, or lentils. Unsalted nuts, nut butters, and seeds. Unsalted canned beans. Lean cuts of beef with fat trimmed off. Low-sodium, lean deli meat. Dairy Low-fat (1%) or fat-free (skim) milk. Fat-free, low-fat, or reduced-fat cheeses. Nonfat, low-sodium ricotta or cottage cheese. Low-fat or nonfat yogurt. Low-fat, low-sodium cheese. Fats and oils Soft margarine without trans fats. Vegetable oil. Low-fat, reduced-fat, or light mayonnaise and salad dressings (reduced-sodium). Canola, safflower, olive, soybean, and sunflower oils. Avocado. Seasoning and other foods Herbs. Spices. Seasoning mixes without salt. Unsalted popcorn and pretzels. Fat-free sweets. What foods are not recommended? The items listed may not be a complete list. Talk with your dietitian about what dietary choices are best for you. Grains Baked goods made with fat, such as croissants, muffins, or some breads. Dry pasta or rice meal packs. Vegetables Creamed or fried vegetables. Vegetables in a cheese sauce. Regular canned vegetables (not low-sodium or reduced-sodium). Regular canned tomato sauce and paste (not low-sodium or reduced-sodium). Regular tomato and vegetable juice (not low-sodium or reduced-sodium). Angie Fava. Olives. Fruits Canned fruit in a light or heavy syrup. Fried fruit. Fruit in cream or butter sauce. Meat and other protein foods Fatty cuts of meat. Ribs. Fried meat. Berniece Salines. Sausage. Bologna and other processed lunch meats. Salami. Fatback. Hotdogs. Bratwurst. Salted nuts and seeds. Canned beans with added salt. Canned or smoked fish. Whole eggs or egg yolks. Chicken or [place] with skin. Dairy Whole or 2% milk,  cream, and half-and-half. Whole or full-fat cream cheese. Whole-fat or sweetened yogurt. Full-fat cheese. Nondairy creamers. Whipped toppings. Processed cheese and cheese spreads. Fats and oils Butter. Stick margarine. Lard. Shortening. Ghee. Bacon fat. Tropical oils, such as coconut, palm kernel, or palm oil. Seasoning and other foods Salted popcorn and pretzels. Onion salt, garlic salt, seasoned salt, table salt, and sea salt. Worcestershire sauce. Tartar sauce. Barbecue sauce. Teriyaki sauce. Soy sauce, including reduced-sodium. Steak sauce. Canned and packaged gravies. Fish sauce. Oyster sauce. Cocktail sauce. Horseradish that you find on the shelf. Ketchup. Mustard. Meat flavorings and tenderizers. Bouillon cubes. Hot sauce and Tabasco sauce. Premade or packaged marinades. Premade or packaged taco seasonings. Relishes. Regular salad dressings. Where to find more information:  National Heart, Lung, and [place]: https://wilson-eaton.com/  American Heart Association: www.heart.org Summary  The DASH eating plan is a healthy eating plan that has been shown to reduce high blood pressure (hypertension). It may also reduce your risk for type 2 diabetes, heart disease, and stroke.  With the DASH eating plan, you should limit salt (sodium) intake to 2,300 mg a day. If you have hypertension, you may need to reduce your sodium intake to 1,500 mg a day.  When on the DASH eating plan, aim to eat more fresh fruits and  vegetables, whole grains, lean proteins, low-fat dairy, and heart-healthy fats.  Work with your health care provider or diet and nutrition specialist (dietitian) to adjust your eating plan to your individual calorie needs. This information is not intended to replace advice given to you by your health care provider. Make sure you discuss any questions you have with your health care provider. Document Released: 07/19/2011 Document Revised: 07/12/2017 Document Reviewed: 07/23/2016 Elsevier  Patient Education  2020 [employer].

## 2019-02-19 NOTE — Progress Notes (Signed)
Established Patient Office Visit  Subjective:  Patient ID: Dennis Waller, male    DOB: 06/08/1951  Age: 68 y.o. MRN: [id number]  CC:  Chief Complaint  Patient presents with  . Hyperlipidemia  . Hypertension    HPI Dennis Waller presents for   Hyperlipidemia-tolerating statin well without any major side effects or problems.  Elevated blood pressure-he is also here today to follow-up for his blood pressure when he was here in May his blood pressure was quite elevated at 154/81 but he was also fighting an infection.  He has not been able to go to the gym since then and so has gained a little bit of weight.  He and his wife are getting ready to start a nutrition program that will really improve his diet as well.  Past Medical History:  Diagnosis Date  . Chronic kidney disease    kidney stones  . Hyperlipidemia     Past Surgical History:  Procedure Laterality Date  . [place]   ? lumbar disc fusion  . KIDNEY STONE SURGERY      Family History  Problem Relation Age of Onset  . Hyperlipidemia Father   . Hypertension Father   . Diabetes Mother   . Hypertension Mother   . Cancer Mother        colon  . COPD Mother     Social History   Socioeconomic History  . Marital status: Married    Spouse name: diane  . Number of children: 3  . Years of education: 29  . Highest education level: 12th grade  Occupational History  . Occupation: retired    Comment: Location manager  Social Needs  . Financial resource strain: Not hard at all  . Food insecurity    Worry: Never true    Inability: Never true  . Transportation needs    Medical: No    Non-medical: No  Tobacco Use  . Smoking status: Former Smoker    Packs/day: 1.00    Years: 55.00    Pack years: 55.00    Types: Cigarettes    Quit date: 11/09/2015    Years since quitting: 3.2  . Smokeless tobacco: Never Used  Substance and Sexual Activity  . Alcohol use: Yes    Comment: social drinker   . Drug use: No  . Sexual activity: Not Currently  Lifestyle  . Physical activity    Days per week: 0 days    Minutes per session: 0 min  . Stress: Not at all  Relationships  . Social Herbalist on phone: Twice a week    Gets together: Twice a week    Attends religious service: More than 4 times per year    Active member of club or organization: No    Attends meetings of clubs or organizations: Never    Relationship status: Married  . Intimate partner violence    Fear of current or ex partner: No    Emotionally abused: No    Physically abused: No    Forced sexual activity: No  Other Topics Concern  . 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 Enjoys playing his trombone.    Outpatient Medications Prior to Visit  Medication Sig Dispense Refill  . AMBULATORY NON FORMULARY MEDICATION Place 1 drop under the tongue daily. Medication Name: CBD oil    . atorvastatin (LIPITOR) 40  MG tablet Take 1 tablet (40 mg total) by mouth daily. 90 tablet 3  . Famotidine (PEPCID AC PO) Take by mouth.     No facility-administered medications prior to visit.     No Known Allergies  ROS Review of Systems    Objective:    Physical Exam  Constitutional: He is oriented to person, place, and time. He appears well-developed and well-nourished.  HENT:  Head: Normocephalic and atraumatic.  Right Ear: External ear normal.  Left Ear: External ear normal.  Nose: Nose normal.  Eyes: Conjunctivae are normal.  Neck: Neck supple.  Cardiovascular: Normal rate, regular rhythm and normal heart sounds.  Pulmonary/Chest: Effort normal and breath sounds normal.  Neurological: He is alert and oriented to person, place, and time.  Skin: Skin is warm and dry.  Psychiatric: He has a normal mood and affect. His behavior is normal.    BP 128/88   Pulse 68   Ht 5\' 10"  (1.778 m)   Wt 197 lb (89.4 kg)   SpO2 99%   BMI 28.27 kg/m  Wt  Readings from Last 3 Encounters:  02/19/19 197 lb (89.4 kg)  05/28/18 196 lb (88.9 kg)  10/30/17 196 lb (88.9 kg)     There are no preventive care reminders to display for this patient.  There are no preventive care reminders to display for this patient.  No results found for: TSH Lab Results  Component Value Date   WBC 6.4 02/12/2013   HGB 12.4 (L) 02/12/2013   HCT 35.4 (L) 02/12/2013   MCV 91.9 02/12/2013   PLT 267 02/12/2013   Lab Results  Component Value Date   NA 137 04/17/2016   K 4.4 04/17/2016   CO2 24 04/17/2016   GLUCOSE 89 04/17/2016   BUN 19 04/17/2016   CREATININE 0.93 04/17/2016   BILITOT 0.3 04/17/2016   ALKPHOS 64 04/17/2016   AST 14 04/17/2016   ALT 13 04/17/2016   PROT 6.8 04/17/2016   ALBUMIN 4.6 04/17/2016   CALCIUM 9.2 04/17/2016   Lab Results  Component Value Date   CHOL 154 04/17/2016   Lab Results  Component Value Date   HDL 38 (L) 04/17/2016   Lab Results  Component Value Date   LDLCALC 43 04/17/2016   Lab Results  Component Value Date   TRIG 366 (H) 04/17/2016   Lab Results  Component Value Date   CHOLHDL 4.1 04/17/2016   Lab Results  Component Value Date   HGBA1C 5.5 02/12/2013      Assessment & Plan:   Problem List Items Addressed This Visit      Other   Low back pain potentially associated with radiculopathy    He has started doing some yoga more recently I think that that actually would be really helpful.  Also gave him the name of you to physical therapist Mikki Santee and Leroy Sea to check out as well I think this can be really helpful to add into his routine.  Would always consider further work-up or referral to orthopedics if he would prefer.  He will let me know.      Hyperlipidemia - Primary    Continue current regimen.  Lipids are up-to-date.  Tolerating well.      Elevated BP without diagnosis of hypertension    Encouraged him to continue to work on healthy diet it sounds like he is got a great nutrition program  moving forward they are going to focus on a Mediterranean diet.  Also work on  increasing activity level he has been doing some yoga because of his back problems it has kept him from walking and being more active.       Other Visit Diagnoses    Screening for malignant neoplasm of prostate          Discussed the new shingles vaccine.  Encouraged him to consider getting this at the pharmacy  No orders of the defined types were placed in this encounter.   Follow-up: Return in about 1 year (around 02/19/2020) for cholestserol .    Beatrice Lecher, MD

## 2019-02-19 NOTE — Assessment & Plan Note (Signed)
Encouraged him to continue to work on healthy diet it sounds like he is got a great nutrition program moving forward they are going to focus on a Mediterranean diet.  Also work on increasing activity level he has been doing some yoga because of his back problems it has kept him from walking and being more active.

## 2019-02-20 LAB — LIPID PANEL
Cholesterol: 188 mg/dL (ref <200)
HDL: 37 mg/dL — ABNORMAL LOW (ref >=40)
LDL Cholesterol (Calc): 112 mg/dL (calc) — ABNORMAL HIGH
Non-HDL Cholesterol (Calc): 151 mg/dL (calc) — ABNORMAL HIGH (ref <130)
Total CHOL/HDL Ratio: 5.1 (calc) — ABNORMAL HIGH (ref <5.0)
Triglycerides: 270 mg/dL — ABNORMAL HIGH (ref <150)

## 2019-02-20 LAB — COMPLETE METABOLIC PANEL WITH GFR
AG Ratio: 2.4 (calc) (ref 1.0–2.5)
ALT: 20 U/L (ref 9–46)
AST: 18 U/L (ref 10–35)
Albumin: 4.8 g/dL (ref 3.6–5.1)
Alkaline phosphatase (APISO): 59 U/L (ref 35–144)
BUN: 21 mg/dL (ref 7–25)
CO2: 25 mmol/L (ref 20–32)
Calcium: 9.5 mg/dL (ref 8.6–10.3)
Chloride: 103 mmol/L (ref 98–110)
Creat: 0.96 mg/dL (ref 0.70–1.25)
GFR, Est African American: 93 mL/min/{1.73_m2} (ref >=60)
GFR, Est Non African American: 80 mL/min/{1.73_m2} (ref >=60)
Globulin: 2 g/dL (calc) (ref 1.9–3.7)
Glucose, Bld: 114 mg/dL — ABNORMAL HIGH (ref 65–99)
Potassium: 3.9 mmol/L (ref 3.5–5.3)
Sodium: 140 mmol/L (ref 135–146)
Total Bilirubin: 0.5 mg/dL (ref 0.2–1.2)
Total Protein: 6.8 g/dL (ref 6.1–8.1)

## 2019-02-20 LAB — PSA: PSA: 4.1 ng/mL — ABNORMAL HIGH (ref <=4.0)

## 2019-02-24 NOTE — Addendum Note (Signed)
Addended by: Teddy Spike on: 02/24/2019 12:31 PM   Modules accepted: Orders

## 2019-05-08 ENCOUNTER — Ambulatory Visit: Payer: Medicare Other

## 2019-05-08 ENCOUNTER — Other Ambulatory Visit: Payer: Self-pay

## 2019-05-08 DIAGNOSIS — Z87891 Personal history of nicotine dependence: Secondary | ICD-10-CM | POA: Diagnosis not present

## 2019-05-08 DIAGNOSIS — Z122 Encounter for screening for malignant neoplasm of respiratory organs: Secondary | ICD-10-CM

## 2019-05-12 ENCOUNTER — Telehealth: Payer: Self-pay | Admitting: Acute Care

## 2019-05-12 DIAGNOSIS — Z87891 Personal history of nicotine dependence: Secondary | ICD-10-CM

## 2019-05-12 DIAGNOSIS — Z122 Encounter for screening for malignant neoplasm of respiratory organs: Secondary | ICD-10-CM

## 2019-05-12 NOTE — Telephone Encounter (Signed)
Pt informed of CT results per Sarah Groce, NP.  PT verbalized understanding.  Copy sent to PCP.  Order placed for 1 yr f/u CT.  

## 2019-06-01 ENCOUNTER — Ambulatory Visit: Payer: Medicare Other

## 2019-07-01 ENCOUNTER — Encounter: Payer: Self-pay | Admitting: Family Medicine

## 2019-07-01 ENCOUNTER — Other Ambulatory Visit: Payer: Self-pay

## 2019-07-01 ENCOUNTER — Ambulatory Visit (INDEPENDENT_AMBULATORY_CARE_PROVIDER_SITE_OTHER): Payer: Medicare Other | Admitting: Family Medicine

## 2019-07-01 VITALS — BP 135/77 | HR 67 | Ht 70.0 in | Wt 198.0 lb

## 2019-07-01 DIAGNOSIS — G5793 Unspecified mononeuropathy of bilateral lower limbs: Secondary | ICD-10-CM | POA: Insufficient documentation

## 2019-07-01 DIAGNOSIS — Z23 Encounter for immunization: Secondary | ICD-10-CM | POA: Diagnosis not present

## 2019-07-01 DIAGNOSIS — R7309 Other abnormal glucose: Secondary | ICD-10-CM | POA: Diagnosis not present

## 2019-07-01 DIAGNOSIS — R3915 Urgency of urination: Secondary | ICD-10-CM | POA: Diagnosis not present

## 2019-07-01 DIAGNOSIS — R030 Elevated blood-pressure reading, without diagnosis of hypertension: Secondary | ICD-10-CM

## 2019-07-01 LAB — POCT GLYCOSYLATED HEMOGLOBIN (HGB A1C): Hemoglobin A1C: 5.6 % (ref 4.0–5.6)

## 2019-07-01 MED ORDER — ZOSTER VAC RECOMB ADJUVANTED 50 MCG/0.5ML IM SUSR: 0.5000 mL | Freq: Once | INTRAMUSCULAR | 0 refills | Status: DC

## 2019-07-01 MED ORDER — ZOSTER VAC RECOMB ADJUVANTED 50 MCG/0.5ML IM SUSR: 0.5000 mL | Freq: Once | INTRAMUSCULAR | 0 refills | Status: AC

## 2019-07-01 NOTE — Progress Notes (Signed)
Established Patient Office Visit  Subjective:  Patient ID: Dennis Waller, male    DOB: [birth year]  Age: 68 y.o. MRN: [id number]  CC:  Chief Complaint  Patient presents with  . Hypertension    HPI Dennis Waller presents for follow-up of elevated blood pressure-when I last saw him he was having issues with elevated blood pressure levels.  He says since then he has made some major changes he has started eating a Mediterranean diet he is really increased his vegetables and cut out fried foods.  He is cut back on his soda and sugar intake significantly.  He says he is working on trying to increase his activity level but says he has not been as consistent with some of the dietary changes that he is made.  He says most of his blood pressures that he is checked have been under 130 which is fantastic.  Is also really been pushing his fluids and trying to stay hydrated with water.  His wife is currently enrolled in a study that is following the Mediterranean diet and so they have decided to do it together.  In regards to his neuropathy he says that he does see a reflexologist monthly and that does make a difference and helps.  In regards to his prostate health he has been taking a supplement called prostate pro.  The main ingredient is saw palmetto.  He says since starting that he has noticed a big difference in his nocturia and has been able to sleep through the night so he has been very happy with those results.  Past Medical History:  Diagnosis Date  . Chronic kidney disease    kidney stones  . Hyperlipidemia     Past Surgical History:  Procedure Laterality Date  . [place]   ? lumbar disc fusion  . KIDNEY STONE SURGERY      Family History  Problem Relation Age of Onset  . Hyperlipidemia Father   . Hypertension Father   . Diabetes Mother   . Hypertension Mother   . Cancer Mother        colon  . COPD Mother     Social History   Socioeconomic History  .  Marital status: Married    Spouse name: diane  . Number of children: 3  . Years of education: 47  . Highest education level: 12th grade  Occupational History  . Occupation: retired    Comment: Location manager  Social Needs  . Financial resource strain: Not hard at all  . Food insecurity    Worry: Never true    Inability: Never true  . Transportation needs    Medical: No    Non-medical: No  Tobacco Use  . Smoking status: Former Smoker    Packs/day: 1.00    Years: 55.00    Pack years: 55.00    Types: Cigarettes    Quit date: 11/09/2015    Years since quitting: 3.6  . Smokeless tobacco: Never Used  Substance and Sexual Activity  . Alcohol use: Yes    Comment: social drinker  . Drug use: No  . Sexual activity: Not Currently  Lifestyle  . Physical activity    Days per week: 0 days    Minutes per session: 0 min  . Stress: Not at all  Relationships  . Social connections    Talks on phone: Twice a week    Gets together: Twice a week    Attends religious  service: More than 4 times per year    Active member of club or organization: No    Attends meetings of clubs or organizations: Never    Relationship status: Married  . Intimate partner violence    Fear of current or ex partner: No    Emotionally abused: No    Physically abused: No    Forced sexual activity: No  Other Topics Concern  . 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 Enjoys playing his trombone.    Outpatient Medications Prior to Visit  Medication Sig Dispense Refill  . AMBULATORY NON FORMULARY MEDICATION Place 1 drop under the tongue daily. Medication Name: CBD oil    . atorvastatin (LIPITOR) 40 MG tablet Take 1 tablet (40 mg total) by mouth daily. 90 tablet 3  . Famotidine (PEPCID AC PO) Take by mouth.     No facility-administered medications prior to visit.     No Known Allergies  ROS Review of Systems    Objective:     Physical Exam  BP 135/77   Pulse 67   Ht 5\' 10"  (1.778 m)   Wt 198 lb (89.8 kg)   SpO2 96%   BMI 28.41 kg/m  Wt Readings from Last 3 Encounters:  07/01/19 198 lb (89.8 kg)  02/19/19 197 lb (89.4 kg)  05/28/18 196 lb (88.9 kg)     There are no preventive care reminders to display for this patient.  There are no preventive care reminders to display for this patient.  No results found for: TSH Lab Results  Component Value Date   WBC 6.4 02/12/2013   HGB 12.4 (L) 02/12/2013   HCT 35.4 (L) 02/12/2013   MCV 91.9 02/12/2013   PLT 267 02/12/2013   Lab Results  Component Value Date   NA 140 02/19/2019   K 3.9 02/19/2019   CO2 25 02/19/2019   GLUCOSE 114 (H) 02/19/2019   BUN 21 02/19/2019   CREATININE 0.96 02/19/2019   BILITOT 0.5 02/19/2019   ALKPHOS 64 04/17/2016   AST 18 02/19/2019   ALT 20 02/19/2019   PROT 6.8 02/19/2019   ALBUMIN 4.6 04/17/2016   CALCIUM 9.5 02/19/2019   Lab Results  Component Value Date   CHOL 188 02/19/2019   Lab Results  Component Value Date   HDL 37 (L) 02/19/2019   Lab Results  Component Value Date   LDLCALC 112 (H) 02/19/2019   Lab Results  Component Value Date   TRIG 270 (H) 02/19/2019   Lab Results  Component Value Date   CHOLHDL 5.1 (H) 02/19/2019   Lab Results  Component Value Date   HGBA1C 5.6 07/01/2019      Assessment & Plan:   Problem List Items Addressed This Visit      Nervous and Auditory   Neuropathy involving both lower extremities    Continue with current care with reflexologist since it seems to be quite helpful.        Other   URINARY URGENCY    Noticed a big improvement in his symptoms since taking prostate pro.      Elevated BP without diagnosis of hypertension - Primary    Pressure looks great today.  I think some of his lifestyle changes have made a huge difference and just encouraged him to really stick with it.       Other Visit Diagnoses    Need for immunization against influenza  Relevant Orders   Flu Vaccine QUAD High Dose(Fluad) (Completed)   Abnormal glucose       Relevant Orders   POCT HgB A1C (Completed)     Flu vaccine given today.  Unfortunately we cannot administer the shingles but he can check with his insurance see if he can get it here in the office or if it is able to be dispensed at the pharmacy and then he can bring it here and we can give it.  Abnormal glucose-recent labs showed a fasting glucose that was mildly elevated so we did do an A1c here in the office today and it is normal at 5.6.  Meds ordered this encounter  Medications  . DISCONTD: Zoster Vaccine Adjuvanted [facility]) injection    Sig: Inject 0.5 mLs into the muscle once for 1 dose.    Dispense:  0.5 mL    Refill:  0  . Zoster Vaccine Adjuvanted [facility]) injection    Sig: Inject 0.5 mLs into the muscle once for 1 dose.    Dispense:  0.5 mL    Refill:  0    Follow-up: Return in about 6 months (around 12/29/2019) for bp.    Beatrice Lecher, MD

## 2019-07-01 NOTE — Assessment & Plan Note (Signed)
Noticed a big improvement in his symptoms since taking prostate pro.

## 2019-07-01 NOTE — Assessment & Plan Note (Signed)
Continue with current care with reflexologist since it seems to be quite helpful.

## 2019-07-01 NOTE — Assessment & Plan Note (Signed)
Pressure looks great today.  I think some of his lifestyle changes have made a huge difference and just encouraged him to really stick with it.

## 2019-10-29 ENCOUNTER — Telehealth: Payer: Self-pay

## 2019-10-29 DIAGNOSIS — Z87891 Personal history of nicotine dependence: Secondary | ICD-10-CM | POA: Diagnosis not present

## 2019-10-29 DIAGNOSIS — K449 Diaphragmatic hernia without obstruction or gangrene: Secondary | ICD-10-CM | POA: Diagnosis not present

## 2019-10-29 DIAGNOSIS — I10 Essential (primary) hypertension: Secondary | ICD-10-CM | POA: Diagnosis not present

## 2019-10-29 DIAGNOSIS — R109 Unspecified abdominal pain: Secondary | ICD-10-CM | POA: Diagnosis not present

## 2019-10-29 DIAGNOSIS — N132 Hydronephrosis with renal and ureteral calculous obstruction: Secondary | ICD-10-CM | POA: Diagnosis not present

## 2019-10-29 DIAGNOSIS — Z79899 Other long term (current) drug therapy: Secondary | ICD-10-CM | POA: Diagnosis not present

## 2019-10-29 DIAGNOSIS — E785 Hyperlipidemia, unspecified: Secondary | ICD-10-CM | POA: Diagnosis not present

## 2019-10-29 DIAGNOSIS — R52 Pain, unspecified: Secondary | ICD-10-CM | POA: Diagnosis not present

## 2019-10-29 DIAGNOSIS — K5730 Diverticulosis of large intestine without perforation or abscess without bleeding: Secondary | ICD-10-CM | POA: Diagnosis not present

## 2019-10-29 DIAGNOSIS — N3289 Other specified disorders of bladder: Secondary | ICD-10-CM | POA: Diagnosis not present

## 2019-10-29 DIAGNOSIS — M5489 Other dorsalgia: Secondary | ICD-10-CM | POA: Diagnosis not present

## 2019-10-29 NOTE — Telephone Encounter (Signed)
[name] states Dennis Waller was diagnosed with kidney stones. He would like Dr Gardiner Ramus recommendations for a urologist in [place].    [phone]

## 2019-10-30 NOTE — Telephone Encounter (Signed)
I do not have a urologist in [place] quite as well as the ones in [place] but there is a great here in town that we do refer to quite frequently.  So I think for something like kidney stones they should be helpful and should be able to do a good job for him.

## 2019-10-30 NOTE — Telephone Encounter (Signed)
Patient advised.

## 2019-11-10 DIAGNOSIS — N200 Calculus of kidney: Secondary | ICD-10-CM | POA: Diagnosis not present

## 2019-11-12 DIAGNOSIS — N200 Calculus of kidney: Secondary | ICD-10-CM | POA: Diagnosis not present

## 2019-12-29 ENCOUNTER — Encounter: Payer: Self-pay | Admitting: Family Medicine

## 2019-12-29 ENCOUNTER — Ambulatory Visit (INDEPENDENT_AMBULATORY_CARE_PROVIDER_SITE_OTHER): Payer: Medicare Other | Admitting: Family Medicine

## 2019-12-29 VITALS — BP 135/72 | HR 62 | Ht 70.0 in | Wt 201.0 lb

## 2019-12-29 DIAGNOSIS — R030 Elevated blood-pressure reading, without diagnosis of hypertension: Secondary | ICD-10-CM

## 2019-12-29 DIAGNOSIS — Z125 Encounter for screening for malignant neoplasm of prostate: Secondary | ICD-10-CM

## 2019-12-29 DIAGNOSIS — Z87442 Personal history of urinary calculi: Secondary | ICD-10-CM | POA: Insufficient documentation

## 2019-12-29 DIAGNOSIS — R7309 Other abnormal glucose: Secondary | ICD-10-CM

## 2019-12-29 DIAGNOSIS — E785 Hyperlipidemia, unspecified: Secondary | ICD-10-CM

## 2019-12-29 LAB — POCT GLYCOSYLATED HEMOGLOBIN (HGB A1C): Hemoglobin A1C: 5.6 % (ref 4.0–5.6)

## 2019-12-29 NOTE — Assessment & Plan Note (Signed)
Currently on Flomax and has really increased his daily water intake.

## 2019-12-29 NOTE — Assessment & Plan Note (Signed)
BP is OK today. Mostly getting BP under 120 at home and doing well.

## 2019-12-29 NOTE — Assessment & Plan Note (Signed)
Lipitor 40 mg daily tolerating well.  He has enough refills until July and will be happy to refill in 1 year at that point.  Due to repeat lipid panel.

## 2019-12-29 NOTE — Progress Notes (Signed)
Established Patient Office Visit  Subjective:  Patient ID: Dennis Waller, male    DOB: 10-Mar-1951  Age: 69 y.o. MRN: [id number]  CC:  Chief Complaint  Patient presents with  . Hypertension    HPI HORST STIDHAM presents for f/u BP.   Hypertension- Pt denies chest pain, SOB, dizziness, or heart palpitations.  Taking meds as directed w/o problems.  Denies medication side effects.    His wife is currently enrolled in an Alzheimer's study working on nutrition and exercise.  He says he is actually been following along with her and been trying to eat more healthy.  He has been relying on CBD Gummies to help with his sleep and arthritis and says it is actually been really helpful.  He just wanted to let you make me aware that he was using it.  He also unfortunately passed some kidney stones recently.  So he has been on Flomax with the urologist.  He says that he was able to collect one of the stones they sent for analysis and it was a calcium stone so is really up to his water intake.  He is try to get in at least 50 ounces of water a day and feels like that actually has been helpful.   Past Medical History:  Diagnosis Date  . Chronic kidney disease    kidney stones  . Hyperlipidemia     Past Surgical History:  Procedure Laterality Date  . [place]   ? lumbar disc fusion  . KIDNEY STONE SURGERY      Family History  Problem Relation Age of Onset  . Hyperlipidemia Father   . Hypertension Father   . Diabetes Mother   . Hypertension Mother   . Cancer Mother        colon  . COPD Mother     Social History   Socioeconomic History  . Marital status: Married    Spouse name: diane  . Number of children: 3  . Years of education: 44  . Highest education level: 12th grade  Occupational History  . Occupation: retired    Comment: Location manager  Tobacco Use  . Smoking status: Former Smoker    Packs/day: 1.00    Years: 55.00    Pack years: 55.00   Types: Cigarettes    Quit date: 11/09/2015    Years since quitting: 4.1  . Smokeless tobacco: Never Used  Substance and Sexual Activity  . Alcohol use: Yes    Comment: social drinker  . Drug use: No  . Sexual activity: Not Currently  Other Topics Concern  . 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 Enjoys playing his trombone.   Social Determinants of Health   Financial Resource Strain:   . Difficulty of Paying Living Expenses:   Food Insecurity:   . Worried About Charity fundraiser in the Last Year:   . Arboriculturist in the Last Year:   Transportation Needs:   . Film/video editor (Medical):   Marland Kitchen Lack of Transportation (Non-Medical):   Physical Activity:   . Days of Exercise per Week:   . Minutes of Exercise per Session:   Stress:   . Feeling of Stress :   Social Connections:   . Frequency of Communication with Friends and Family:   . Frequency of Social Gatherings with Friends and Family:   . Attends  Religious Services:   . Active Member of Clubs or Organizations:   . Attends Archivist Meetings:   Marland Kitchen Marital Status:   Intimate Partner Violence:   . Fear of Current or Ex-Partner:   . Emotionally Abused:   Marland Kitchen Physically Abused:   . Sexually Abused:     Outpatient Medications Prior to Visit  Medication Sig Dispense Refill  . AMBULATORY NON FORMULARY MEDICATION Take 1 each by mouth daily. Medication Name: CBD gummy for sleep and arthritis    . atorvastatin (LIPITOR) 40 MG tablet Take 1 tablet (40 mg total) by mouth daily. 90 tablet 3  . tamsulosin (FLOMAX) 0.4 MG CAPS capsule Take 0.4 mg by mouth at bedtime.     No facility-administered medications prior to visit.    No Known Allergies  ROS Review of Systems    Objective:    Physical Exam  Constitutional: He is oriented to person, place, and time. He appears well-developed and well-nourished.  HENT:  Head: Normocephalic and  atraumatic.  Cardiovascular: Normal rate, regular rhythm and normal heart sounds.  Pulmonary/Chest: Effort normal and breath sounds normal.  Neurological: He is alert and oriented to person, place, and time.  Skin: Skin is warm and dry.  Psychiatric: He has a normal mood and affect. His behavior is normal.    BP 135/72   Pulse 62   Ht 5\' 10"  (1.778 m)   Wt 201 lb (91.2 kg)   BMI 28.84 kg/m  Wt Readings from Last 3 Encounters:  12/29/19 201 lb (91.2 kg)  07/01/19 198 lb (89.8 kg)  02/19/19 197 lb (89.4 kg)     There are no preventive care reminders to display for this patient.  There are no preventive care reminders to display for this patient.  No results found for: TSH Lab Results  Component Value Date   WBC 6.4 02/12/2013   HGB 12.4 (L) 02/12/2013   HCT 35.4 (L) 02/12/2013   MCV 91.9 02/12/2013   PLT 267 02/12/2013   Lab Results  Component Value Date   NA 140 02/19/2019   K 3.9 02/19/2019   CO2 25 02/19/2019   GLUCOSE 114 (H) 02/19/2019   BUN 21 02/19/2019   CREATININE 0.96 02/19/2019   BILITOT 0.5 02/19/2019   ALKPHOS 64 04/17/2016   AST 18 02/19/2019   ALT 20 02/19/2019   PROT 6.8 02/19/2019   ALBUMIN 4.6 04/17/2016   CALCIUM 9.5 02/19/2019   Lab Results  Component Value Date   CHOL 188 02/19/2019   Lab Results  Component Value Date   HDL 37 (L) 02/19/2019   Lab Results  Component Value Date   LDLCALC 112 (H) 02/19/2019   Lab Results  Component Value Date   TRIG 270 (H) 02/19/2019   Lab Results  Component Value Date   CHOLHDL 5.1 (H) 02/19/2019   Lab Results  Component Value Date   HGBA1C 5.6 12/29/2019      Assessment & Plan:   Problem List Items Addressed This Visit      Other   Hyperlipidemia    Lipitor 40 mg daily tolerating well.  He has enough refills until July and will be happy to refill in 1 year at that point.  Due to repeat lipid panel.      Relevant Orders   COMPLETE METABOLIC PANEL WITH GFR   Lipid panel    History of kidney stones    Currently on Flomax and has really increased his daily water intake.  Elevated BP without diagnosis of hypertension - Primary    BP is OK today. Mostly getting BP under 120 at home and doing well.        Relevant Orders   COMPLETE METABOLIC PANEL WITH GFR   Lipid panel    Other Visit Diagnoses    Screening PSA (prostate specific antigen)       Relevant Orders   PSA   Abnormal glucose       Relevant Orders   POCT glycosylated hemoglobin (Hb A1C) (Completed)     Normal glucose-hemoglobin A1c actually looks really good today.  Just monitor periodically.  No orders of the defined types were placed in this encounter.   Follow-up: Return in about 7 months (around 07/30/2020), or if symptoms worsen or fail to improve.    Beatrice Lecher, MD

## 2020-01-06 ENCOUNTER — Encounter: Payer: Self-pay | Admitting: Family Medicine

## 2020-01-26 ENCOUNTER — Other Ambulatory Visit: Payer: Self-pay

## 2020-01-26 MED ORDER — TAMSULOSIN HCL 0.4 MG PO CAPS: 0.4000 mg | ORAL_CAPSULE | Freq: Every day | ORAL | 3 refills | Status: DC

## 2020-01-26 NOTE — Telephone Encounter (Signed)
Patient wanting to know if Dr Madilyn Fireman will take over prescribing Flomax. He had HX of kidney stone and has run out 4 days ago, noticed a difference in urination.   RX pended, please send if you are OK taking over prescribing this. Wants sent to local [facility]

## 2020-01-26 NOTE — Telephone Encounter (Signed)
rx sent

## 2020-02-05 DIAGNOSIS — R030 Elevated blood-pressure reading, without diagnosis of hypertension: Secondary | ICD-10-CM | POA: Diagnosis not present

## 2020-02-05 DIAGNOSIS — E785 Hyperlipidemia, unspecified: Secondary | ICD-10-CM | POA: Diagnosis not present

## 2020-02-06 LAB — LIPID PANEL
Cholesterol: 159 mg/dL (ref <200)
HDL: 36 mg/dL — ABNORMAL LOW (ref >=40)
LDL Cholesterol (Calc): 88 mg/dL (calc)
Non-HDL Cholesterol (Calc): 123 mg/dL (calc) (ref <130)
Total CHOL/HDL Ratio: 4.4 (calc) (ref <5.0)
Triglycerides: 265 mg/dL — ABNORMAL HIGH (ref <150)

## 2020-02-06 LAB — COMPLETE METABOLIC PANEL WITH GFR
AG Ratio: 2 (calc) (ref 1.0–2.5)
ALT: 17 U/L (ref 9–46)
AST: 15 U/L (ref 10–35)
Albumin: 4.5 g/dL (ref 3.6–5.1)
Alkaline phosphatase (APISO): 59 U/L (ref 35–144)
BUN: 17 mg/dL (ref 7–25)
CO2: 27 mmol/L (ref 20–32)
Calcium: 8.9 mg/dL (ref 8.6–10.3)
Chloride: 105 mmol/L (ref 98–110)
Creat: 1.03 mg/dL (ref 0.70–1.18)
GFR, Est African American: 85 mL/min/{1.73_m2} (ref >=60)
GFR, Est Non African American: 73 mL/min/{1.73_m2} (ref >=60)
Globulin: 2.2 g/dL (calc) (ref 1.9–3.7)
Glucose, Bld: 100 mg/dL — ABNORMAL HIGH (ref 65–99)
Potassium: 4.1 mmol/L (ref 3.5–5.3)
Sodium: 139 mmol/L (ref 135–146)
Total Bilirubin: 0.4 mg/dL (ref 0.2–1.2)
Total Protein: 6.7 g/dL (ref 6.1–8.1)

## 2020-02-06 LAB — PSA: PSA: 3.6 ng/mL (ref <=4.0)

## 2020-02-08 NOTE — Progress Notes (Signed)
Call pt:  Dennis Waller are still high. Has doctor Madilyn Fireman ever talked to him about fish oil or Dennis Waller medication?  LDL better.  PSA better.  Kidney and liver good.

## 2020-02-09 ENCOUNTER — Other Ambulatory Visit: Payer: Self-pay | Admitting: Physician Assistant

## 2020-02-09 MED ORDER — FENOFIBRATE 145 MG PO TABS: 145.0000 mg | ORAL_TABLET | Freq: Every day | ORAL | 3 refills | Status: DC

## 2020-02-09 NOTE — Progress Notes (Signed)
Recheck in 6 months.

## 2020-02-10 ENCOUNTER — Other Ambulatory Visit: Payer: Self-pay

## 2020-02-10 DIAGNOSIS — E782 Mixed hyperlipidemia: Secondary | ICD-10-CM

## 2020-02-10 NOTE — Progress Notes (Signed)
Labs ordered for 6 mths check per covering provider.

## 2020-03-21 ENCOUNTER — Other Ambulatory Visit: Payer: Self-pay | Admitting: Family Medicine

## 2020-03-21 DIAGNOSIS — E785 Hyperlipidemia, unspecified: Secondary | ICD-10-CM

## 2020-05-13 ENCOUNTER — Telehealth: Payer: Self-pay | Admitting: Acute Care

## 2020-05-13 DIAGNOSIS — Z87891 Personal history of nicotine dependence: Secondary | ICD-10-CM

## 2020-05-16 NOTE — Telephone Encounter (Signed)
Anita, can you contact pt to schedule f/u low dose ct?  

## 2020-05-16 NOTE — Telephone Encounter (Signed)
Per Bonnita Nasuti at [facility] in [place] I will need new CT order to schedule this CT.

## 2020-05-16 NOTE — Telephone Encounter (Signed)
I have spoke with the patient and his LCS CT has been scheduled at [facility] in [place] on 05/18/2020 @ 9:00am

## 2020-05-16 NOTE — Telephone Encounter (Signed)
New  CT order placed

## 2020-05-18 ENCOUNTER — Ambulatory Visit (INDEPENDENT_AMBULATORY_CARE_PROVIDER_SITE_OTHER): Payer: Medicare Other

## 2020-05-18 ENCOUNTER — Other Ambulatory Visit: Payer: Self-pay

## 2020-05-18 DIAGNOSIS — I700 Atherosclerosis of aorta: Secondary | ICD-10-CM

## 2020-05-18 DIAGNOSIS — Z87891 Personal history of nicotine dependence: Secondary | ICD-10-CM

## 2020-05-18 DIAGNOSIS — J439 Emphysema, unspecified: Secondary | ICD-10-CM | POA: Diagnosis not present

## 2020-05-26 NOTE — Progress Notes (Signed)

## 2020-05-30 ENCOUNTER — Encounter: Payer: Self-pay | Admitting: Family Medicine

## 2020-05-30 ENCOUNTER — Other Ambulatory Visit: Payer: Self-pay | Admitting: *Deleted

## 2020-05-30 DIAGNOSIS — J439 Emphysema, unspecified: Secondary | ICD-10-CM | POA: Insufficient documentation

## 2020-05-30 DIAGNOSIS — Z87891 Personal history of nicotine dependence: Secondary | ICD-10-CM

## 2020-06-10 ENCOUNTER — Ambulatory Visit (INDEPENDENT_AMBULATORY_CARE_PROVIDER_SITE_OTHER): Payer: Medicare Other | Admitting: Family Medicine

## 2020-06-10 DIAGNOSIS — Z23 Encounter for immunization: Secondary | ICD-10-CM | POA: Diagnosis not present

## 2020-11-04 ENCOUNTER — Ambulatory Visit (INDEPENDENT_AMBULATORY_CARE_PROVIDER_SITE_OTHER): Payer: Medicare Other | Admitting: Family Medicine

## 2020-11-04 ENCOUNTER — Other Ambulatory Visit: Payer: Self-pay

## 2020-11-04 VITALS — BP 129/76 | HR 64 | Resp 16 | Ht 69.0 in | Wt 190.1 lb

## 2020-11-04 DIAGNOSIS — Z0000 Encounter for general adult medical examination without abnormal findings: Secondary | ICD-10-CM

## 2020-11-04 NOTE — Progress Notes (Signed)
MEDICARE ANNUAL WELLNESS VISIT  11/04/2020  Subjective:  Dennis Waller is a 70 y.o. male patient of Metheney, Rene Kocher, MD who had a Medicare Annual Wellness Visit today. Dennis Waller is Retired and lives with their spouse. he has 3 children. he reports that he is socially active and does interact with friends/family regularly. he is minimally physically active and enjoys reading, music and fishing.  Patient Care Team: Dennis Marry, MD as PCP - General  Advanced Directives 11/04/2020 05/28/2018 06/11/2014  Does Patient Have a Medical Advance Directive? No Yes No  Type of Advance Directive - [place];Out of facility DNR (pink MOST or yellow form);Living will -  Does patient want to make changes to medical advance directive? - No - Patient declined -  Copy of [place] in Chart? - No - copy requested -  Would patient like information on creating a medical advance directive? No - Patient declined - No - patient declined information    Hospital Utilization Over the Past 12 Months: # of hospitalizations or ER visits: 0 # of surgeries: 0  Review of Systems    Patient reports that his overall health is unchanged when compared to last year.  Review of Systems: History obtained from chart review and the patient  All other systems negative.  Pain Assessment Pain : 0-10 Pain Score: 3  Pain Type: Chronic pain Pain Location: Back Pain Orientation: Lower Pain Descriptors / Indicators: Aching Pain Onset: More than a month ago Pain Frequency: Intermittent Pain Relieving Factors: medication  Pain Relieving Factors: medication  Current Medications & Allergies (verified) Allergies as of 11/04/2020   No Known Allergies     Medication List       Accurate as of November 04, 2020  9:35 AM. If you have any questions, ask your nurse or doctor.        acetaminophen 500 MG tablet Commonly known as: TYLENOL Take 500 mg by mouth every 6 (six)  hours as needed.   AMBULATORY NON FORMULARY MEDICATION Take 1 each by mouth daily. Medication Name: CBD gummy for sleep and arthritis   AMBULATORY NON FORMULARY MEDICATION Medication Name: Prostate Plus  Takes two pills daily.   atorvastatin 40 MG tablet Commonly known as: LIPITOR TAKE 1 TABLET BY MOUTH  DAILY   fenofibrate 145 MG tablet Commonly known as: TRICOR Take 1 tablet (145 mg total) by mouth daily. For triglycerides.   ibuprofen 200 MG tablet Commonly known as: ADVIL Take 200 mg by mouth every 6 (six) hours as needed.   tamsulosin 0.4 MG Caps capsule Commonly known as: FLOMAX Take 1 capsule (0.4 mg total) by mouth at bedtime.       History (reviewed): Past Medical History:  Diagnosis Date  . Chronic kidney disease    kidney stones  . Hyperlipidemia    Past Surgical History:  Procedure Laterality Date  . [place]   ? lumbar disc fusion  . KIDNEY STONE SURGERY     Family History  Problem Relation Age of Onset  . Hyperlipidemia Father   . Hypertension Father   . Diabetes Mother   . Hypertension Mother   . Cancer Mother        colon  . COPD Mother    Social History   Socioeconomic History  . Marital status: Married    Spouse name: Dennis Waller  . Number of children: 3  . Years of education: 54  . Highest education level: 12th grade  Occupational History  . Occupation: retired    Comment: Public house manager - airplanes  Tobacco Use  . Smoking status: Former Smoker    Packs/day: 1.00    Years: 55.00    Pack years: 55.00    Types: Cigarettes    Quit date: 11/09/2015    Years since quitting: 4.9  . Smokeless tobacco: Never Used  Vaping Use  . Vaping Use: Never used  Substance and Sexual Activity  . Alcohol use: Yes    Comment: social drinker  . Drug use: No  . Sexual activity: Not Currently  Other Topics Concern  . Not on file  Social History Narrative   In the band at church. Wants to get back in exercise routine but due to his  arthritis he is not able to do stand for long periods of time. Enjoys playing his trombone. He enjoys reading and fishing.   Social Determinants of Health   Financial Resource Strain: Low Risk   . Difficulty of Paying Living Expenses: Not hard at all  Food Insecurity: No Food Insecurity  . Worried About Charity fundraiser in the Last Year: Never true  . Ran Out of Food in the Last Year: Never true  Transportation Needs: No Transportation Needs  . Lack of Transportation (Medical): No  . Lack of Transportation (Non-Medical): No  Physical Activity: Inactive  . Days of Exercise per Week: 0 days  . Minutes of Exercise per Session: 0 min  Stress: No Stress Concern Present  . Feeling of Stress : Not at all  Social Connections: Socially Integrated  . Frequency of Communication with Friends and Family: Twice a week  . Frequency of Social Gatherings with Friends and Family: Twice a week  . Attends Religious Services: More than 4 times per year  . Active Member of Clubs or Organizations: Yes  . Attends Archivist Meetings: More than 4 times per year  . Marital Status: Married    Activities of Daily Living In your present state of health, do you have any difficulty performing the following activities: 11/04/2020  Hearing? N  Vision? N  Difficulty concentrating or making decisions? N  Walking or climbing stairs? Y  Comment walking- due to arthritis , he takes his time and can't stand for long period of times.  Dressing or bathing? N  Doing errands, shopping? N  Preparing Food and eating ? N  Using the Toilet? N  In the past six months, have you accidently leaked urine? N  Do you have problems with loss of bowel control? N  Managing your Medications? N  Managing your Finances? N  Housekeeping or managing your Housekeeping? N  Some recent data might be hidden    Patient Education/Literacy How often do you need to have someone help you when you read instructions, pamphlets, or  other written materials from your doctor or pharmacy?: 1 - Never What is the last grade level you completed in school?: 12th grade  Exercise Current Exercise Habits: The patient does not participate in regular exercise at present, Exercise limited by: orthopedic condition(s)  Diet Patient reports consuming 2.5 meals a day and 3-4 snack(s) a day Patient reports that his primary diet is: Regular Patient reports that she does have regular access to food.   Depression Screen PHQ 2/9 Scores 11/04/2020 07/01/2019 12/18/2018 05/28/2018 06/12/2017 10/17/2015 06/11/2014  PHQ - 2 Score 0 0 0 0 0 0 0     Fall Risk Fall Risk  11/04/2020 07/01/2019 05/28/2018  07/11/2017 10/17/2015  Falls in the past year? 1 0 No No Yes  Number falls in past yr: 0 0 - - 2 or more  Injury with Fall? 1 0 - - No  Comment hurt his toe - - - -  Risk for fall due to : No Fall Risks - - - -  Follow up Falls evaluation completed - - - Education provided     Objective:   BP 129/76 (BP Location: Right Arm, Patient Position: Sitting, Cuff Size: Normal)   Pulse 64   Resp 16   Ht 5\' 9"  (1.753 m)   Wt 190 lb 1.9 oz (86.2 kg)   SpO2 99%   BMI 28.08 kg/m   Last Weight  Most recent update: 11/04/2020  8:52 AM   Weight  86.2 kg (190 lb 1.9 oz)            Body mass index is 28.08 kg/m.  Hearing/Vision  . Viyaan did not have difficulty with hearing/understanding during the face-to-face interview . Jacaden did not have difficulty with his vision during the face-to-face interview . Reports that he has not had a formal eye exam by an eye care professional within the past year . Reports that he has not had a formal hearing evaluation within the past year  Cognitive Function: 6CIT Screen 11/04/2020 05/28/2018  What Year? 0 points 0 points  What month? 0 points 0 points  What time? 0 points 0 points  Count back from 20 0 points 0 points  Months in reverse 0 points 0 points  Repeat phrase 0 points 0 points  Total Score 0 0     Normal Cognitive Function Screening: Yes (Normal:0-7, Significant for Dysfunction: >8)  Immunization & Health Maintenance Record Immunization History  Administered Date(s) Administered  . Fluad Quad(high Dose 65+) 07/01/2019, 06/10/2020  . Influenza, High Dose Seasonal PF 06/12/2017  . Influenza,inj,Quad PF,6+ Mos 07/02/2013, 06/11/2014, 10/17/2015, 04/17/2016, 05/28/2018  . PFIZER(Purple Top)SARS-COV-2 Vaccination 10/02/2019, 10/27/2019, 06/07/2020  . Pneumococcal Conjugate-13 06/11/2014  . Pneumococcal Polysaccharide-23 08/13/2004, 10/17/2015  . Tdap 03/09/2011  . Zoster 03/09/2011  . Zoster Recombinat (Shingrix) 07/15/2019, 09/16/2019    Health Maintenance  Topic Date Due  . Samul Dada  03/08/2021  . COLONOSCOPY (Pts 45-29yrs Insurance coverage will need to be confirmed)  04/22/2021  . INFLUENZA VACCINE  Completed  . COVID-19 Vaccine  Completed  . Hepatitis C Screening  Completed  . PNA vac Low Risk Adult  Completed  . HPV VACCINES  Aged Out       Assessment  This is a routine wellness examination for [employer].  Health Maintenance: Due or Overdue There are no preventive care reminders to display for this patient.  [name] does not need a referral for Community Assistance: Care Management:   no Social Work:    no Prescription Assistance:  no Nutrition/Diabetes Education:  no   Plan:  Personalized Goals Goals Addressed              This Visit's Progress   .  Patient Stated (pt-stated)        11/04/2020 AWV Goal: Improved Nutrition/Diet  . Patient will verbalize understanding that diet plays an important role in overall health and that a poor diet is a risk factor for many chronic medical conditions.  . Over the next year, patient will improve self management of their diet by incorporating better variety and drink more water. . Patient will utilize available community resources to help with food  acquisition if needed (ex: food  pantries, [address], etc) . Patient will work with nutrition specialist if a referral was made       Personalized Health Maintenance & Screening Recommendations  Tdap - in July, 2022. Colonoscopy- September, 2022  Lung Cancer Screening Recommended: yes (Low Dose CT Chest recommended if Age 68-80 years, 30 pack-year currently smoking OR have quit w/in past 15 years) Hepatitis C Screening recommended: no HIV Screening recommended: no  Advanced Directives: Written information was not given per the patient's request.  Referrals & Orders No orders of the defined types were placed in this encounter.   Follow-up Plan . Follow-up with Dennis Marry, MD as planned . Schedule your tetanus shot in July at your pharmacy. . You are due for your colonoscopy in September; please let us know if you need a referral.  . Medicare wellness in one year.   I have personally reviewed and noted the following in the patient's chart:   . Medical and social history . Use of alcohol, tobacco or illicit drugs  . Current medications and supplements . Functional ability and status . Nutritional status . Physical activity . Advanced directives . List of other physicians . Hospitalizations, surgeries, and ER visits in previous 12 months . Vitals . Screenings to include cognitive, depression, and falls . Referrals and appointments  In addition, I have reviewed and discussed with patient certain preventive protocols, quality metrics, and best practice recommendations. A written personalized care plan for preventive services as well as general preventive health recommendations were provided to patient.     Tinnie Gens, RN  11/04/2020

## 2020-11-04 NOTE — Patient Instructions (Addendum)
[place] Maintenance Summary and Written Plan of Care  Dennis Waller ,  Thank you for allowing me to perform your Medicare Annual Wellness Visit and for your ongoing commitment to your health.   Health Maintenance & Immunization History Health Maintenance  Topic Date Due  . TETANUS/TDAP  03/08/2021  . COLONOSCOPY (Pts 45-88yrs Insurance coverage will need to be confirmed)  04/22/2021  . INFLUENZA VACCINE  Completed  . COVID-19 Vaccine  Completed  . Hepatitis C Screening  Completed  . PNA vac Low Risk Adult  Completed  . HPV VACCINES  Aged Out   Immunization History  Administered Date(s) Administered  . Fluad Quad(high Dose 65+) 07/01/2019, 06/10/2020  . Influenza, High Dose Seasonal PF 06/12/2017  . Influenza,inj,Quad PF,6+ Mos 07/02/2013, 06/11/2014, 10/17/2015, 04/17/2016, 05/28/2018  . PFIZER(Purple Top)SARS-COV-2 Vaccination 10/02/2019, 10/27/2019, 06/07/2020  . Pneumococcal Conjugate-13 06/11/2014  . Pneumococcal Polysaccharide-23 08/13/2004, 10/17/2015  . Tdap 03/09/2011  . Zoster 03/09/2011  . Zoster Recombinat (Shingrix) 07/15/2019, 09/16/2019    These are the patient goals that we discussed: Goals Addressed              This Visit's Progress   .  Patient Stated (pt-stated)        11/04/2020 AWV Goal: Improved Nutrition/Diet  . Patient will verbalize understanding that diet plays an important role in overall health and that a poor diet is a risk factor for many chronic medical conditions.  . Over the next year, patient will improve self management of their diet by incorporating better variety and drink more water. . Patient will utilize available community resources to help with food acquisition if needed (ex: food pantries, [address], etc) . Patient will work with nutrition specialist if a referral was made         This is a list of Health Maintenance Items that are overdue or due now: Tdap - in July, 2022. Colonoscopy-  September, 2022  Orders/Referrals Placed Today: No orders of the defined types were placed in this encounter.  (Contact our referral department at [phone] if you have not spoken with someone about your referral appointment within the next 5 days)    Follow-up Plan . Follow-up with Hali Marry, MD as planned . Schedule your tetanus shot in July at your pharmacy. . You are due for your colonoscopy in September; please let us know if you need a referral.  . Medicare wellness in one year.    Colonoscopy, Adult A colonoscopy is a procedure to look at the entire large intestine. This procedure is done using a long, thin, flexible tube that has a camera on the end. You may have a colonoscopy:  As a part of normal colorectal screening.  If you have certain symptoms, such as: ? A low number of red blood cells in your blood (anemia). ? Diarrhea that does not go away. ? Pain in your abdomen. ? Blood in your stool. A colonoscopy can help screen for and diagnose medical problems, including:  Tumors.  Extra tissue that grows where mucus forms (polyps).  Inflammation.  Areas of bleeding. Tell your health care provider about:  Any allergies you have.  All medicines you are taking, including vitamins, herbs, eye drops, creams, and over-the-counter medicines.  Any problems you or family members have had with anesthetic medicines.  Any blood disorders you have.  Any surgeries you have had.  Any medical conditions you have.  Any problems you have had with having bowel movements.  Whether you are pregnant or may be pregnant. What are the risks? Generally, this is a safe procedure. However, problems may occur, including:  Bleeding.  Damage to your intestine.  Allergic reactions to medicines given during the procedure.  Infection. This is rare. What happens before the procedure? Eating and drinking restrictions Follow instructions from your health care  provider about eating or drinking restrictions, which may include:  A few days before the procedure: ? Follow a low-fiber diet. ? Avoid nuts, seeds, dried fruit, raw fruits, and vegetables.  1-3 days before the procedure: ? Eat only gelatin dessert or ice pops. ? Drink only clear liquids, such as water, clear juice, clear broth or bouillon, black coffee or tea, or clear soft drinks or sports drinks. ? Avoid liquids that contain red or purple dye.  The day of the procedure: ? Do not eat solid foods. You may continue to drink clear liquids until up to 2 hours before the procedure. ? Do not eat or drink anything starting 2 hours before the procedure, or within the time period that your health care provider recommends. Bowel prep If you were prescribed a bowel prep to take by mouth (orally) to clean out your colon:  Take it as told by your health care provider. Starting the day before your procedure, you will need to drink a large amount of liquid medicine. The liquid will cause you to have many bowel movements of loose stool until your stool becomes almost clear or light green.  If your skin or the opening between the buttocks (anus) gets irritated from diarrhea, you may relieve the irritation using: ? Wipes with medicine in them, such as adult wet wipes with aloe and vitamin E. ? A product to soothe skin, such as petroleum jelly.  If you vomit while drinking the bowel prep: ? Take a break for up to 60 minutes. ? Begin the bowel prep again. ? Call your health care provider if you keep vomiting or you cannot take the bowel prep without vomiting.  To clean out your colon, you may also be given: ? Laxative medicines. These help you have a bowel movement. ? Instructions for enema use. An enema is liquid medicine injected into your rectum. Medicines Ask your health care provider about:  Changing or stopping your regular medicines or supplements. This is especially important if you are  taking iron supplements, diabetes medicines, or blood thinners.  Taking medicines such as aspirin and ibuprofen. These medicines can thin your blood. Do not take these medicines unless your health care provider tells you to take them.  Taking over-the-counter medicines, vitamins, herbs, and supplements. General instructions  Ask your health care provider what steps will be taken to help prevent infection. These may include washing skin with a germ-killing soap.  Plan to have someone take you home from the hospital or clinic. What happens during the procedure?  An IV will be inserted into one of your veins.  You may be given one or more of the following: ? A medicine to help you relax (sedative). ? A medicine to numb the area (local anesthetic). ? A medicine to make you fall asleep (general anesthetic). This is rarely needed.  You will lie on your side with your knees bent.  The tube will: ? Have oil or gel put on it (be lubricated). ? Be inserted into your anus. ? Be gently eased through all parts of your large intestine.  Air will be sent into your colon to  keep it open. This may cause some pressure or cramping.  Images will be taken with the camera and will appear on a screen.  A small tissue sample may be removed to be looked at under a microscope (biopsy). The tissue may be sent to a lab for testing if any signs of problems are found.  If small polyps are found, they may be removed and checked for cancer cells.  When the procedure is finished, the tube will be removed. The procedure may vary among health care providers and hospitals.   What happens after the procedure?  Your blood pressure, heart rate, breathing rate, and blood oxygen level will be monitored until you leave the hospital or clinic.  You may have a small amount of blood in your stool.  You may pass gas and have mild cramping or bloating in your abdomen. This is caused by the air that was used to open your  colon during the exam.  Do not drive for 24 hours after the procedure.  It is up to you to get the results of your procedure. Ask your health care provider, or the department that is doing the procedure, when your results will be ready. Summary  A colonoscopy is a procedure to look at the entire large intestine.  Follow instructions from your health care provider about eating and drinking before the procedure.  If you were prescribed an oral bowel prep to clean out your colon, take it as told by your health care provider.  During the colonoscopy, a flexible tube with a camera on its end is inserted into the anus and then passed into the other parts of the large intestine. This information is not intended to replace advice given to you by your health care provider. Make sure you discuss any questions you have with your health care provider. Document Revised: 02/20/2019 Document Reviewed: 02/20/2019 Elsevier Patient Education  [place] Maintenance, Male Adopting a healthy lifestyle and getting preventive care are important in promoting health and wellness. Ask your health care provider about:  The right schedule for you to have regular tests and exams.  Things you can do on your own to prevent diseases and keep yourself healthy. What should I know about diet, weight, and exercise? Eat a healthy diet  Eat a diet that includes plenty of vegetables, fruits, low-fat dairy products, and lean protein.  Do not eat a lot of foods that are high in solid fats, added sugars, or sodium.   Maintain a healthy weight Body mass index (BMI) is a measurement that can be used to identify possible weight problems. It estimates body fat based on height and weight. Your health care provider can help determine your BMI and help you achieve or maintain a healthy weight. Get regular exercise Get regular exercise. This is one of the most important things you can do for your health. Most  adults should:  Exercise for at least 150 minutes each week. The exercise should increase your heart rate and make you sweat (moderate-intensity exercise).  Do strengthening exercises at least twice a week. This is in addition to the moderate-intensity exercise.  Spend less time sitting. Even light physical activity can be beneficial. Watch cholesterol and blood lipids Have your blood tested for lipids and cholesterol at 72 years of age, then have this test every 5 years. You may need to have your cholesterol levels checked more often if:  Your lipid or cholesterol levels are high.  You are older than 71 years of age.  You are at high risk for heart disease. What should I know about cancer screening? Many types of cancers can be detected early and may often be prevented. Depending on your health history and family history, you may need to have cancer screening at various ages. This may include screening for:  Colorectal cancer.  Prostate cancer.  Skin cancer.  Lung cancer. What should I know about heart disease, diabetes, and high blood pressure? Blood pressure and heart disease  High blood pressure causes heart disease and increases the risk of stroke. This is more likely to develop in people who have high blood pressure readings, are of African descent, or are overweight.  Talk with your health care provider about your target blood pressure readings.  Have your blood pressure checked: ? Every 3-5 years if you are 22-80 years of age. ? Every year if you are 61 years old or older.  If you are between the ages of 76 and 60 and are a current or former smoker, ask your health care provider if you should have a one-time screening for abdominal aortic aneurysm (AAA). Diabetes Have regular diabetes screenings. This checks your fasting blood sugar level. Have the screening done:  Once every three years after age 46 if you are at a normal weight and have a low risk for  diabetes.  More often and at a younger age if you are overweight or have a high risk for diabetes. What should I know about preventing infection? Hepatitis B If you have a higher risk for hepatitis B, you should be screened for this virus. Talk with your health care provider to find out if you are at risk for hepatitis B infection. Hepatitis C Blood testing is recommended for:  Everyone born from [birth year] through 1967.  Anyone with known risk factors for hepatitis C. Sexually transmitted infections (STIs)  You should be screened each year for STIs, including gonorrhea and chlamydia, if: ? You are sexually active and are younger than 72 years of age. ? You are older than 72 years of age and your health care provider tells you that you are at risk for this type of infection. ? Your sexual activity has changed since you were last screened, and you are at increased risk for chlamydia or gonorrhea. Ask your health care provider if you are at risk.  Ask your health care provider about whether you are at high risk for HIV. Your health care provider may recommend a prescription medicine to help prevent HIV infection. If you choose to take medicine to prevent HIV, you should first get tested for HIV. You should then be tested every 3 months for as long as you are taking the medicine. Follow these instructions at home: Lifestyle  Do not use any products that contain nicotine or tobacco, such as cigarettes, e-cigarettes, and chewing tobacco. If you need help quitting, ask your health care provider.  Do not use street drugs.  Do not share needles.  Ask your health care provider for help if you need support or information about quitting drugs. Alcohol use  Do not drink alcohol if your health care provider tells you not to drink.  If you drink alcohol: ? Limit how much you have to 0-2 drinks a day. ? Be aware of how much alcohol is in your drink. In the [place] one drink equals one 12 oz bottle of  beer (355 mL), one 5 oz glass of wine (  148 mL), or one 1 oz glass of hard liquor (44 mL). General instructions  Schedule regular health, dental, and eye exams.  Stay current with your vaccines.  Tell your health care provider if: ? You often feel depressed. ? You have ever been abused or do not feel safe at home. Summary  Adopting a healthy lifestyle and getting preventive care are important in promoting health and wellness.  Follow your health care provider's instructions about healthy diet, exercising, and getting tested or screened for diseases.  Follow your health care provider's instructions on monitoring your cholesterol and blood pressure. This information is not intended to replace advice given to you by your health care provider. Make sure you discuss any questions you have with your health care provider. Document Revised: 07/23/2018 Document Reviewed: 07/23/2018 Elsevier Patient Education  2021 [employer].

## 2021-01-13 ENCOUNTER — Other Ambulatory Visit: Payer: Self-pay | Admitting: Family Medicine

## 2021-04-10 ENCOUNTER — Other Ambulatory Visit: Payer: Self-pay | Admitting: Family Medicine

## 2021-04-10 DIAGNOSIS — E785 Hyperlipidemia, unspecified: Secondary | ICD-10-CM

## 2021-05-29 ENCOUNTER — Ambulatory Visit (INDEPENDENT_AMBULATORY_CARE_PROVIDER_SITE_OTHER): Payer: Medicare Other

## 2021-05-29 ENCOUNTER — Other Ambulatory Visit: Payer: Self-pay

## 2021-05-29 DIAGNOSIS — Z87891 Personal history of nicotine dependence: Secondary | ICD-10-CM | POA: Diagnosis not present

## 2021-06-02 ENCOUNTER — Other Ambulatory Visit: Payer: Self-pay | Admitting: Acute Care

## 2021-06-02 DIAGNOSIS — Z87891 Personal history of nicotine dependence: Secondary | ICD-10-CM

## 2021-07-05 ENCOUNTER — Ambulatory Visit (INDEPENDENT_AMBULATORY_CARE_PROVIDER_SITE_OTHER): Payer: Medicare Other | Admitting: Family Medicine

## 2021-07-05 ENCOUNTER — Other Ambulatory Visit: Payer: Self-pay

## 2021-07-05 ENCOUNTER — Encounter: Payer: Self-pay | Admitting: Family Medicine

## 2021-07-05 VITALS — BP 127/61 | HR 65 | Ht 69.0 in | Wt 179.0 lb

## 2021-07-05 DIAGNOSIS — Z125 Encounter for screening for malignant neoplasm of prostate: Secondary | ICD-10-CM | POA: Diagnosis not present

## 2021-07-05 DIAGNOSIS — Z1211 Encounter for screening for malignant neoplasm of colon: Secondary | ICD-10-CM | POA: Diagnosis not present

## 2021-07-05 DIAGNOSIS — J439 Emphysema, unspecified: Secondary | ICD-10-CM

## 2021-07-05 DIAGNOSIS — Z23 Encounter for immunization: Secondary | ICD-10-CM

## 2021-07-05 DIAGNOSIS — R3915 Urgency of urination: Secondary | ICD-10-CM | POA: Diagnosis not present

## 2021-07-05 DIAGNOSIS — G5793 Unspecified mononeuropathy of bilateral lower limbs: Secondary | ICD-10-CM

## 2021-07-05 DIAGNOSIS — E785 Hyperlipidemia, unspecified: Secondary | ICD-10-CM | POA: Diagnosis not present

## 2021-07-05 NOTE — Patient Instructions (Signed)
Recommend get your Tdap done at the local pharmacy.

## 2021-07-05 NOTE — Assessment & Plan Note (Signed)
Doing well on current regimen. Due to recheck cholesterol and liver enzymes.

## 2021-07-05 NOTE — Assessment & Plan Note (Signed)
Stable

## 2021-07-05 NOTE — Progress Notes (Signed)
Established Patient Office Visit  Subjective:  Patient ID: Dennis Waller, male    DOB: 1950-12-25  Age: 70 y.o. MRN: [id number]  CC:  Chief Complaint  Patient presents with   Hypertension    HPI Dennis Waller presents for   Hyperlipidemia - tolerating stating well with no myalgias or significant side effects.  Lab Results  Component Value Date   CHOL 159 02/05/2020   HDL 36 (L) 02/05/2020   LDLCALC 88 02/05/2020   TRIG 265 (H) 02/05/2020   CHOLHDL 4.4 02/05/2020    F/U COPD - doing well overall.    Follow-up neuropathy-overall he is doing okay he says a couple times a month he will wake up and feel that uncomfortable sensation in his feet and legs.  Usually just taken Advil and Tylenol together and that usually helps him go back to sleep.  Prior history of low B12 and we have not checked that in several years.  He is very concerned about his wife and her new dx of Parkinson's disease.   Past Medical History:  Diagnosis Date   Chronic kidney disease    kidney stones   Hyperlipidemia     Past Surgical History:  Procedure Laterality Date   BACK SURGERY  1997   ? lumbar disc fusion   KIDNEY STONE SURGERY      Family History  Problem Relation Age of Onset   Hyperlipidemia Father    Hypertension Father    Diabetes Mother    Hypertension Mother    Cancer Mother        colon   COPD Mother     Social History   Socioeconomic History   Marital status: Married    Spouse name: diane   Number of children: 3   Years of education: 12   Highest education level: 12th grade  Occupational History   Occupation: retired    Comment: Public house manager - airplanes  Tobacco Use   Smoking status: Former    Packs/day: 1.00    Years: 55.00    Pack years: 55.00    Types: Cigarettes    Quit date: 11/09/2015    Years since quitting: 5.6   Smokeless tobacco: Never  Vaping Use   Vaping Use: Never used  Substance and Sexual Activity   Alcohol use: Yes     Comment: social drinker   Drug use: No   Sexual activity: Not Currently  Other Topics Concern   Not on file  Social History Narrative   In the band at church. Wants to get back in exercise routine but due to his arthritis he is not able to do stand for long periods of time. Enjoys playing his trombone. He enjoys reading and fishing.   Social Determinants of Health   Financial Resource Strain: Low Risk    Difficulty of Paying Living Expenses: Not hard at all  Food Insecurity: No Food Insecurity   Worried About Charity fundraiser in the Last Year: Never true   [place] in the Last Year: Never true  Transportation Needs: No Transportation Needs   Lack of Transportation (Medical): No   Lack of Transportation (Non-Medical): No  Physical Activity: Inactive   Days of Exercise per Week: 0 days   Minutes of Exercise per Session: 0 min  Stress: No Stress Concern Present   Feeling of Stress : Not at all  Social Connections: Socially Integrated   Frequency of Communication with Friends and Family: Twice a  week   Frequency of Social Gatherings with Friends and Family: Twice a week   Attends Religious Services: More than 4 times per year   Active Member of [employer] or Organizations: Yes   Attends Music therapist: More than 4 times per year   Marital Status: Married  Human resources officer Violence: Not At Risk   Fear of Current or Ex-Partner: No   Emotionally Abused: No   Physically Abused: No   Sexually Abused: No    Outpatient Medications Prior to Visit  Medication Sig Dispense Refill   acetaminophen (TYLENOL) 500 MG tablet Take 500 mg by mouth every 6 (six) hours as needed.     AMBULATORY NON FORMULARY MEDICATION Take 1 each by mouth daily. Medication Name: CBD gummy for sleep and arthritis     atorvastatin (LIPITOR) 40 MG tablet TAKE 1 TABLET BY MOUTH  DAILY 90 tablet 3   ibuprofen (ADVIL) 200 MG tablet Take 200 mg by mouth every 6 (six) hours as needed.     tamsulosin  (FLOMAX) 0.4 MG CAPS capsule TAKE 1 CAPSULE BY MOUTH EVERYDAY AT BEDTIME 90 capsule 3   AMBULATORY NON FORMULARY MEDICATION Medication Name: Prostate Plus  Takes two pills daily.     fenofibrate (TRICOR) 145 MG tablet Take 1 tablet (145 mg total) by mouth daily. For triglycerides. 90 tablet 3   No facility-administered medications prior to visit.    No Known Allergies  ROS Review of Systems    Objective:    Physical Exam Constitutional:      Appearance: Normal appearance. He is well-developed.  HENT:     Head: Normocephalic and atraumatic.  Neck:     Vascular: No carotid bruit.  Cardiovascular:     Rate and Rhythm: Normal rate and regular rhythm.     Heart sounds: Normal heart sounds.  Pulmonary:     Effort: Pulmonary effort is normal.     Breath sounds: Normal breath sounds.  Skin:    General: Skin is warm and dry.  Neurological:     Mental Status: He is alert and oriented to person, place, and time. Mental status is at baseline.  Psychiatric:        Behavior: Behavior normal.    BP 127/61   Pulse 65   Ht 5\' 9"  (1.753 m)   Wt 179 lb (81.2 kg)   SpO2 98%   BMI 26.43 kg/m  Wt Readings from Last 3 Encounters:  07/05/21 179 lb (81.2 kg)  11/04/20 190 lb 1.9 oz (86.2 kg)  12/29/19 201 lb (91.2 kg)     Health Maintenance Due  Topic Date Due   TETANUS/TDAP  03/08/2021   COLONOSCOPY (Pts 45-64yrs Insurance coverage will need to be confirmed)  04/22/2021    There are no preventive care reminders to display for this patient.  No results found for: TSH Lab Results  Component Value Date   WBC 6.4 02/12/2013   HGB 12.4 (L) 02/12/2013   HCT 35.4 (L) 02/12/2013   MCV 91.9 02/12/2013   PLT 267 02/12/2013   Lab Results  Component Value Date   NA 139 02/05/2020   K 4.1 02/05/2020   CO2 27 02/05/2020   GLUCOSE 100 (H) 02/05/2020   BUN 17 02/05/2020   CREATININE 1.03 02/05/2020   BILITOT 0.4 02/05/2020   ALKPHOS 64 04/17/2016   AST 15 02/05/2020   ALT 17  02/05/2020   PROT 6.7 02/05/2020   ALBUMIN 4.6 04/17/2016   CALCIUM 8.9 02/05/2020  Lab Results  Component Value Date   CHOL 159 02/05/2020   Lab Results  Component Value Date   HDL 36 (L) 02/05/2020   Lab Results  Component Value Date   LDLCALC 88 02/05/2020   Lab Results  Component Value Date   TRIG 265 (H) 02/05/2020   Lab Results  Component Value Date   CHOLHDL 4.4 02/05/2020   Lab Results  Component Value Date   HGBA1C 5.6 12/29/2019      Assessment & Plan:   Problem List Items Addressed This Visit       Respiratory   COPD (chronic obstructive pulmonary disease) with emphysema ([place])    Stable.         Nervous and Auditory   Neuropathy involving both lower extremities   Relevant Orders   Lipid Panel w/reflex Direct LDL   COMPLETE METABOLIC PANEL WITH GFR   CBC   B12     Other   URINARY URGENCY    Flomax seems to be working really well happy with his current regimen.      Hyperlipidemia    Doing well on current regimen. Due to recheck cholesterol and liver enzymes.      Relevant Orders   Lipid Panel w/reflex Direct LDL   COMPLETE METABOLIC PANEL WITH GFR   CBC   Other Visit Diagnoses     Need for immunization against influenza    -  Primary   Relevant Orders   Flu Vaccine QUAD High Dose(Fluad) (Completed)   Lipid Panel w/reflex Direct LDL   COMPLETE METABOLIC PANEL WITH GFR   CBC   Screen for colon cancer       Relevant Orders   Ambulatory referral to Gastroenterology   Screening for prostate cancer       Relevant Orders   PSA       No orders of the defined types were placed in this encounter.   Follow-up: No follow-ups on file.    Beatrice Lecher, MD

## 2021-07-05 NOTE — Assessment & Plan Note (Signed)
Flomax seems to be working really well happy with his current regimen.

## 2021-07-17 DIAGNOSIS — E785 Hyperlipidemia, unspecified: Secondary | ICD-10-CM | POA: Diagnosis not present

## 2021-07-17 DIAGNOSIS — Z8639 Personal history of other endocrine, nutritional and metabolic disease: Secondary | ICD-10-CM | POA: Diagnosis not present

## 2021-07-17 DIAGNOSIS — G5793 Unspecified mononeuropathy of bilateral lower limbs: Secondary | ICD-10-CM | POA: Diagnosis not present

## 2021-07-17 DIAGNOSIS — Z23 Encounter for immunization: Secondary | ICD-10-CM | POA: Diagnosis not present

## 2021-07-18 LAB — LIPID PANEL W/REFLEX DIRECT LDL
Cholesterol: 139 mg/dL (ref <200)
HDL: 41 mg/dL (ref >=40)
LDL Cholesterol (Calc): 72 mg/dL (calc)
Non-HDL Cholesterol (Calc): 98 mg/dL (calc) (ref <130)
Total CHOL/HDL Ratio: 3.4 (calc) (ref <5.0)
Triglycerides: 179 mg/dL — ABNORMAL HIGH (ref <150)

## 2021-07-18 LAB — COMPLETE METABOLIC PANEL WITH GFR
AG Ratio: 2 (calc) (ref 1.0–2.5)
ALT: 9 U/L (ref 9–46)
AST: 13 U/L (ref 10–35)
Albumin: 4.4 g/dL (ref 3.6–5.1)
Alkaline phosphatase (APISO): 50 U/L (ref 35–144)
BUN/Creatinine Ratio: 17 (calc) (ref 6–22)
BUN: 22 mg/dL (ref 7–25)
CO2: 24 mmol/L (ref 20–32)
Calcium: 9 mg/dL (ref 8.6–10.3)
Chloride: 107 mmol/L (ref 98–110)
Creat: 1.29 mg/dL — ABNORMAL HIGH (ref 0.70–1.28)
Globulin: 2.2 g/dL (calc) (ref 1.9–3.7)
Glucose, Bld: 89 mg/dL (ref 65–99)
Potassium: 4.5 mmol/L (ref 3.5–5.3)
Sodium: 141 mmol/L (ref 135–146)
Total Bilirubin: 0.4 mg/dL (ref 0.2–1.2)
Total Protein: 6.6 g/dL (ref 6.1–8.1)
eGFR: 59 mL/min/{1.73_m2} — ABNORMAL LOW (ref >=60)

## 2021-07-18 LAB — CBC
HCT: 35.2 % — ABNORMAL LOW (ref 38.5–50.0)
Hemoglobin: 11.6 g/dL — ABNORMAL LOW (ref 13.2–17.1)
MCH: 32.3 pg (ref 27.0–33.0)
MCHC: 33 g/dL (ref 32.0–36.0)
MCV: 98.1 fL (ref 80.0–100.0)
MPV: 9 fL (ref 7.5–12.5)
Platelets: 232 10*3/uL (ref 140–400)
RBC: 3.59 10*6/uL — ABNORMAL LOW (ref 4.20–5.80)
RDW: 11.9 % (ref 11.0–15.0)
WBC: 7.5 10*3/uL (ref 3.8–10.8)

## 2021-07-18 LAB — VITAMIN B12: Vitamin B-12: 329 pg/mL (ref 200–1100)

## 2021-07-18 LAB — PSA: PSA: 3.83 ng/mL (ref <=4.00)

## 2021-07-18 NOTE — Progress Notes (Signed)
Hi Dennis Waller, triglycerides look much better and LDL is also down which is a great improvement.  Kidney function is up just slightly at 1.2 you look a little bit more dry on your blood work this time compared to your previous labs.  I like to recheck a BMP in a couple of weeks just to make sure that it is improving.  Liver function is normal.  You are also mildly anemic with a hemoglobin of 11.6.  Have you noticed any blood in the urine or stool?  Looks like you are due for repeat colonoscopy so try to get that scheduled as soon as you can.  Prostate test is normal.  B12 looks good.

## 2021-07-31 DIAGNOSIS — N200 Calculus of kidney: Secondary | ICD-10-CM | POA: Diagnosis not present

## 2021-07-31 DIAGNOSIS — Z79899 Other long term (current) drug therapy: Secondary | ICD-10-CM | POA: Diagnosis not present

## 2021-07-31 DIAGNOSIS — N132 Hydronephrosis with renal and ureteral calculous obstruction: Secondary | ICD-10-CM | POA: Diagnosis not present

## 2021-07-31 DIAGNOSIS — R112 Nausea with vomiting, unspecified: Secondary | ICD-10-CM | POA: Diagnosis not present

## 2021-07-31 DIAGNOSIS — N201 Calculus of ureter: Secondary | ICD-10-CM | POA: Diagnosis not present

## 2021-07-31 DIAGNOSIS — Z466 Encounter for fitting and adjustment of urinary device: Secondary | ICD-10-CM | POA: Diagnosis not present

## 2021-07-31 DIAGNOSIS — K449 Diaphragmatic hernia without obstruction or gangrene: Secondary | ICD-10-CM | POA: Diagnosis not present

## 2021-07-31 DIAGNOSIS — Z87891 Personal history of nicotine dependence: Secondary | ICD-10-CM | POA: Diagnosis not present

## 2021-07-31 DIAGNOSIS — N179 Acute kidney failure, unspecified: Secondary | ICD-10-CM | POA: Diagnosis not present

## 2021-07-31 DIAGNOSIS — E785 Hyperlipidemia, unspecified: Secondary | ICD-10-CM | POA: Diagnosis not present

## 2021-07-31 DIAGNOSIS — R109 Unspecified abdominal pain: Secondary | ICD-10-CM | POA: Diagnosis not present

## 2021-07-31 DIAGNOSIS — K828 Other specified diseases of gallbladder: Secondary | ICD-10-CM | POA: Diagnosis not present

## 2021-07-31 DIAGNOSIS — N210 Calculus in bladder: Secondary | ICD-10-CM | POA: Diagnosis not present

## 2021-07-31 DIAGNOSIS — K5790 Diverticulosis of intestine, part unspecified, without perforation or abscess without bleeding: Secondary | ICD-10-CM | POA: Diagnosis not present

## 2021-08-16 DIAGNOSIS — N210 Calculus in bladder: Secondary | ICD-10-CM | POA: Diagnosis not present

## 2021-08-16 DIAGNOSIS — N200 Calculus of kidney: Secondary | ICD-10-CM | POA: Diagnosis not present

## 2021-08-16 DIAGNOSIS — Z79899 Other long term (current) drug therapy: Secondary | ICD-10-CM | POA: Diagnosis not present

## 2021-08-16 DIAGNOSIS — M1990 Unspecified osteoarthritis, unspecified site: Secondary | ICD-10-CM | POA: Diagnosis not present

## 2021-08-16 DIAGNOSIS — N132 Hydronephrosis with renal and ureteral calculous obstruction: Secondary | ICD-10-CM | POA: Diagnosis not present

## 2021-08-16 DIAGNOSIS — E785 Hyperlipidemia, unspecified: Secondary | ICD-10-CM | POA: Diagnosis not present

## 2021-08-16 DIAGNOSIS — Z87891 Personal history of nicotine dependence: Secondary | ICD-10-CM | POA: Diagnosis not present

## 2021-08-16 DIAGNOSIS — Z960 Presence of urogenital implants: Secondary | ICD-10-CM | POA: Diagnosis not present

## 2021-08-16 DIAGNOSIS — N201 Calculus of ureter: Secondary | ICD-10-CM | POA: Diagnosis not present

## 2021-09-06 DIAGNOSIS — R338 Other retention of urine: Secondary | ICD-10-CM | POA: Diagnosis not present

## 2021-09-06 DIAGNOSIS — N138 Other obstructive and reflux uropathy: Secondary | ICD-10-CM | POA: Diagnosis not present

## 2021-09-06 DIAGNOSIS — Z79899 Other long term (current) drug therapy: Secondary | ICD-10-CM | POA: Diagnosis not present

## 2021-09-06 DIAGNOSIS — Z87891 Personal history of nicotine dependence: Secondary | ICD-10-CM | POA: Diagnosis not present

## 2021-09-06 DIAGNOSIS — M1990 Unspecified osteoarthritis, unspecified site: Secondary | ICD-10-CM | POA: Diagnosis not present

## 2021-09-06 DIAGNOSIS — E785 Hyperlipidemia, unspecified: Secondary | ICD-10-CM | POA: Diagnosis not present

## 2021-09-06 DIAGNOSIS — N202 Calculus of kidney with calculus of ureter: Secondary | ICD-10-CM | POA: Diagnosis not present

## 2021-09-06 DIAGNOSIS — Z960 Presence of urogenital implants: Secondary | ICD-10-CM | POA: Diagnosis not present

## 2021-09-06 DIAGNOSIS — N323 Diverticulum of bladder: Secondary | ICD-10-CM | POA: Diagnosis not present

## 2021-09-06 DIAGNOSIS — N210 Calculus in bladder: Secondary | ICD-10-CM | POA: Diagnosis not present

## 2021-09-07 DIAGNOSIS — N210 Calculus in bladder: Secondary | ICD-10-CM | POA: Diagnosis not present

## 2021-09-07 DIAGNOSIS — N400 Enlarged prostate without lower urinary tract symptoms: Secondary | ICD-10-CM | POA: Insufficient documentation

## 2021-09-07 DIAGNOSIS — Z87891 Personal history of nicotine dependence: Secondary | ICD-10-CM | POA: Diagnosis not present

## 2021-09-07 DIAGNOSIS — E785 Hyperlipidemia, unspecified: Secondary | ICD-10-CM | POA: Diagnosis not present

## 2021-09-07 DIAGNOSIS — M1990 Unspecified osteoarthritis, unspecified site: Secondary | ICD-10-CM | POA: Diagnosis not present

## 2021-09-07 DIAGNOSIS — Z960 Presence of urogenital implants: Secondary | ICD-10-CM | POA: Diagnosis not present

## 2021-09-07 DIAGNOSIS — Z79899 Other long term (current) drug therapy: Secondary | ICD-10-CM | POA: Diagnosis not present

## 2021-09-07 DIAGNOSIS — N323 Diverticulum of bladder: Secondary | ICD-10-CM | POA: Diagnosis not present

## 2021-09-07 DIAGNOSIS — R338 Other retention of urine: Secondary | ICD-10-CM | POA: Diagnosis not present

## 2021-09-07 DIAGNOSIS — N138 Other obstructive and reflux uropathy: Secondary | ICD-10-CM | POA: Diagnosis not present

## 2021-09-08 DIAGNOSIS — E785 Hyperlipidemia, unspecified: Secondary | ICD-10-CM | POA: Diagnosis not present

## 2021-09-08 DIAGNOSIS — N138 Other obstructive and reflux uropathy: Secondary | ICD-10-CM | POA: Diagnosis not present

## 2021-09-08 DIAGNOSIS — M1990 Unspecified osteoarthritis, unspecified site: Secondary | ICD-10-CM | POA: Diagnosis not present

## 2021-09-08 DIAGNOSIS — Z87891 Personal history of nicotine dependence: Secondary | ICD-10-CM | POA: Diagnosis not present

## 2021-09-08 DIAGNOSIS — R338 Other retention of urine: Secondary | ICD-10-CM | POA: Diagnosis not present

## 2021-09-08 DIAGNOSIS — N323 Diverticulum of bladder: Secondary | ICD-10-CM | POA: Diagnosis not present

## 2021-09-08 DIAGNOSIS — N210 Calculus in bladder: Secondary | ICD-10-CM | POA: Diagnosis not present

## 2021-09-08 DIAGNOSIS — Z960 Presence of urogenital implants: Secondary | ICD-10-CM | POA: Diagnosis not present

## 2021-09-08 DIAGNOSIS — Z79899 Other long term (current) drug therapy: Secondary | ICD-10-CM | POA: Diagnosis not present

## 2021-09-15 DIAGNOSIS — Z466 Encounter for fitting and adjustment of urinary device: Secondary | ICD-10-CM | POA: Diagnosis not present

## 2021-09-15 DIAGNOSIS — N200 Calculus of kidney: Secondary | ICD-10-CM | POA: Diagnosis not present

## 2021-09-18 ENCOUNTER — Encounter: Payer: Self-pay | Admitting: Family Medicine

## 2021-09-18 ENCOUNTER — Other Ambulatory Visit: Payer: Self-pay

## 2021-09-18 ENCOUNTER — Ambulatory Visit (INDEPENDENT_AMBULATORY_CARE_PROVIDER_SITE_OTHER): Payer: Medicare Other | Admitting: Family Medicine

## 2021-09-18 VITALS — BP 131/60 | HR 65 | Resp 18 | Ht 69.0 in | Wt 166.0 lb

## 2021-09-18 DIAGNOSIS — D500 Iron deficiency anemia secondary to blood loss (chronic): Secondary | ICD-10-CM | POA: Diagnosis not present

## 2021-09-18 DIAGNOSIS — Z87442 Personal history of urinary calculi: Secondary | ICD-10-CM

## 2021-09-18 DIAGNOSIS — R300 Dysuria: Secondary | ICD-10-CM | POA: Diagnosis not present

## 2021-09-18 DIAGNOSIS — R209 Unspecified disturbances of skin sensation: Secondary | ICD-10-CM | POA: Diagnosis not present

## 2021-09-18 DIAGNOSIS — K2100 Gastro-esophageal reflux disease with esophagitis, without bleeding: Secondary | ICD-10-CM | POA: Diagnosis not present

## 2021-09-18 NOTE — Assessment & Plan Note (Signed)
Currently taking over-the-counter Zantac once a day and that seems to be controlling his symptoms.

## 2021-09-18 NOTE — Progress Notes (Signed)
Established Patient Office Visit  Subjective:  Patient ID: Dennis Waller, male    DOB: [birth year]  Age: 71 y.o. MRN: [id number]  CC:  Chief Complaint  Patient presents with   [facility]. Would like to discuss kidney surgeries. Patient would like to discuss if he should continue taking Flomax.     HPI Dennis Waller presents for follow-up from recent outpatient surgeries and cystoscopies.  It all started on December 19 when he started passing a kidney stone that was approximately 1 cm in size they ended up placing a stent.  And then about 1-1/2 weeks later had a procedure to try to use laser to blast the stones.  Then he was still having difficulty passing some of the stones and sludge so they did a prescription scraping of the bladder and were able to remove a lot of the stones.  He recently did a postvoid residual for BPH and they told him that if everything was okay he could stop the Flomax.  He also did want to mention that he had some side effects with the oxybutynin when he was taking that for the bladder spasms.  He says that it made his mouth very dry but it also caused his tongue to burn and noticed sores on his tongue as well.  He also had severe GERD during one of his hospital overnight stays and has been taking Zantac once a day and that seems to be controlling his symptoms.  After one of the procedures he did develop a severe UTI and had a fever to 103.  He has now been off of his antibiotics and feeling better for about the last week.  He is also noted that he is felt very cold particularly in his hands since all of this started.  His labs show that his last CBC got down to 8.4 in regards to his hemoglobin.  Past Medical History:  Diagnosis Date   Chronic kidney disease    kidney stones   Hyperlipidemia     Past Surgical History:  Procedure Laterality Date   BACK SURGERY  1997   ? lumbar disc fusion   KIDNEY STONE SURGERY      Family History   Problem Relation Age of Onset   Hyperlipidemia Father    Hypertension Father    Diabetes Mother    Hypertension Mother    Cancer Mother        colon   COPD Mother     Social History   Socioeconomic History   Marital status: Married    Spouse name: diane   Number of children: 3   Years of education: 12   Highest education level: 12th grade  Occupational History   Occupation: retired    Comment: Public house manager - airplanes  Tobacco Use   Smoking status: Former    Packs/day: 1.00    Years: 55.00    Pack years: 55.00    Types: Cigarettes    Quit date: 11/09/2015    Years since quitting: 5.8   Smokeless tobacco: Never  Vaping Use   Vaping Use: Never used  Substance and Sexual Activity   Alcohol use: Yes    Comment: social drinker   Drug use: No   Sexual activity: Not Currently  Other Topics Concern   Not on file  Social History Narrative   In the band at church. Wants to get back in exercise routine but due to his arthritis he is not  able to do stand for long periods of time. Enjoys playing his trombone. He enjoys reading and fishing.   Social Determinants of Health   Financial Resource Strain: Low Risk    Difficulty of Paying Living Expenses: Not hard at all  Food Insecurity: No Food Insecurity   Worried About Charity fundraiser in the Last Year: Never true   [place] in the Last Year: Never true  Transportation Needs: No Transportation Needs   Lack of Transportation (Medical): No   Lack of Transportation (Non-Medical): No  Physical Activity: Inactive   Days of Exercise per Week: 0 days   Minutes of Exercise per Session: 0 min  Stress: No Stress Concern Present   Feeling of Stress : Not at all  Social Connections: Socially Integrated   Frequency of Communication with Friends and Family: Twice a week   Frequency of Social Gatherings with Friends and Family: Twice a week   Attends Religious Services: More than 4 times per year   Active Member of  [employer] or Organizations: Yes   Attends Music therapist: More than 4 times per year   Marital Status: Married  Human resources officer Violence: Not At Risk   Fear of Current or Ex-Partner: No   Emotionally Abused: No   Physically Abused: No   Sexually Abused: No    Outpatient Medications Prior to Visit  Medication Sig Dispense Refill   acetaminophen (TYLENOL) 500 MG tablet Take 500 mg by mouth every 6 (six) hours as needed.     AMBULATORY NON FORMULARY MEDICATION Take 1 each by mouth daily. Medication Name: CBD gummy for sleep and arthritis     atorvastatin (LIPITOR) 40 MG tablet TAKE 1 TABLET BY MOUTH  DAILY 90 tablet 3   ibuprofen (ADVIL) 200 MG tablet Take 200 mg by mouth every 6 (six) hours as needed.     oxyCODONE-acetaminophen (PERCOCET/ROXICET) 5-325 MG tablet PLEASE SEE ATTACHED FOR DETAILED DIRECTIONS     tamsulosin (FLOMAX) 0.4 MG CAPS capsule TAKE 1 CAPSULE BY MOUTH EVERYDAY AT BEDTIME 90 capsule 3   oxybutynin (DITROPAN) 5 MG tablet Take 5 mg by mouth 3 (three) times daily.     No facility-administered medications prior to visit.    No Known Allergies  ROS Review of Systems    Objective:    Physical Exam  BP 131/60    Pulse 65    Resp 18    Ht 5' 9"  (1.753 m)    Wt 166 lb (75.3 kg)    SpO2 100%    BMI 24.51 kg/m  Wt Readings from Last 3 Encounters:  09/18/21 166 lb (75.3 kg)  07/05/21 179 lb (81.2 kg)  11/04/20 190 lb 1.9 oz (86.2 kg)     Health Maintenance Due  Topic Date Due   TETANUS/TDAP  03/08/2021   COLONOSCOPY (Pts 45-32yr Insurance coverage will need to be confirmed)  04/22/2021    There are no preventive care reminders to display for this patient.  No results found for: TSH Lab Results  Component Value Date   WBC 7.5 07/17/2021   HGB 11.6 (L) 07/17/2021   HCT 35.2 (L) 07/17/2021   MCV 98.1 07/17/2021   PLT 232 07/17/2021   Lab Results  Component Value Date   NA 141 07/17/2021   K 4.5 07/17/2021   CO2 24 07/17/2021   GLUCOSE  89 07/17/2021   BUN 22 07/17/2021   CREATININE 1.29 (H) 07/17/2021   BILITOT 0.4 07/17/2021  ALKPHOS 64 04/17/2016   AST 13 07/17/2021   ALT 9 07/17/2021   PROT 6.6 07/17/2021   ALBUMIN 4.6 04/17/2016   CALCIUM 9.0 07/17/2021   EGFR 59 (L) 07/17/2021   Lab Results  Component Value Date   CHOL 139 07/17/2021   Lab Results  Component Value Date   HDL 41 07/17/2021   Lab Results  Component Value Date   LDLCALC 72 07/17/2021   Lab Results  Component Value Date   TRIG 179 (H) 07/17/2021   Lab Results  Component Value Date   CHOLHDL 3.4 07/17/2021   Lab Results  Component Value Date   HGBA1C 5.6 12/29/2019      Assessment & Plan:   Problem List Items Addressed This Visit       Digestive   GERD (gastroesophageal reflux disease)    Currently taking over-the-counter Zantac once a day and that seems to be controlling his symptoms.        Other   History of kidney stones    He is feeling a lot better in general.  So hopefully he will continue to do well for a while.  Encouraged him to continue to work on drinking plenty of fluids and water.      Other Visit Diagnoses     Blood loss anemia    -  Primary   Relevant Orders   CBC   TSH   BASIC METABOLIC PANEL WITH GFR   Fe+TIBC+Fer   Cold hands       Relevant Orders   CBC   TSH   BASIC METABOLIC PANEL WITH GFR   Fe+TIBC+Fer       Cold hands-suspect secondary to anemia he had a pretty significantly low hemoglobin after passing a lot of clots.  Like to recheck that today to make sure that it is bouncing back also check his thyroid level as well as electrolytes.  Blood loss anemia again-recheck hemoglobin to make sure that it is coming up nicely after recent surgical procedure.  Will evaluate for iron deficiency.  He actually is headed over to urology later today to give a urine sample.  No orders of the defined types were placed in this encounter.   Follow-up: Return in about 4 months (around  01/16/2022).    Beatrice Lecher, MD

## 2021-09-18 NOTE — Assessment & Plan Note (Signed)
He is feeling a lot better in general.  So hopefully he will continue to do well for a while.  Encouraged him to continue to work on drinking plenty of fluids and water.

## 2021-09-19 LAB — CBC
HCT: 30.5 % — ABNORMAL LOW (ref 38.5–50.0)
Hemoglobin: 9.9 g/dL — ABNORMAL LOW (ref 13.2–17.1)
MCH: 30.9 pg (ref 27.0–33.0)
MCHC: 32.5 g/dL (ref 32.0–36.0)
MCV: 95.3 fL (ref 80.0–100.0)
MPV: 8.9 fL (ref 7.5–12.5)
Platelets: 487 10*3/uL — ABNORMAL HIGH (ref 140–400)
RBC: 3.2 10*6/uL — ABNORMAL LOW (ref 4.20–5.80)
RDW: 11.7 % (ref 11.0–15.0)
WBC: 7.4 10*3/uL (ref 3.8–10.8)

## 2021-09-19 LAB — IRON,TIBC AND FERRITIN PANEL
%SAT: 13 % (calc) — ABNORMAL LOW (ref 20–48)
Ferritin: 70 ng/mL (ref 24–380)
Iron: 52 ug/dL (ref 50–180)
TIBC: 392 mcg/dL (calc) (ref 250–425)

## 2021-09-19 LAB — TSH: TSH: 1.94 mIU/L (ref 0.40–4.50)

## 2021-09-19 LAB — BASIC METABOLIC PANEL WITH GFR
BUN/Creatinine Ratio: 10 (calc) (ref 6–22)
BUN: 15 mg/dL (ref 7–25)
CO2: 26 mmol/L (ref 20–32)
Calcium: 9 mg/dL (ref 8.6–10.3)
Chloride: 101 mmol/L (ref 98–110)
Creat: 1.55 mg/dL — ABNORMAL HIGH (ref 0.70–1.28)
Glucose, Bld: 95 mg/dL (ref 65–99)
Potassium: 4.8 mmol/L (ref 3.5–5.3)
Sodium: 136 mmol/L (ref 135–146)
eGFR: 47 mL/min/{1.73_m2} — ABNORMAL LOW (ref >=60)

## 2021-09-19 NOTE — Progress Notes (Signed)
Hi Dennis Waller, you are still very anemic your hemoglobin is 9.  For comparison a couple of months ago it was 11.6.  But when you left the hospital it was 8.4.  So it is trending up.  But you are anemic it is probably making you feel more tired and would probably also explain your extra cold hands.  Your iron levels are on the low end probably from the anemia and blood loss.  So I would encourage you to increase iron in your diet.  And consider taking an over-the-counter iron supplement for a few months.  They can be constipating so you may have to take a stool softener with that if that occurs.  Also, the only other concerning finding is your kidney function has jumped up.  Normally you run around 0.9-1.0.  And this time it is 1.5.  For you that is a significant jump so I do want to keep an eye on that and plan to recheck again in about 34 weeks to give your kidneys time to recover and see if that improves.

## 2021-10-09 ENCOUNTER — Encounter: Payer: Self-pay | Admitting: Family Medicine

## 2021-10-09 ENCOUNTER — Other Ambulatory Visit: Payer: Self-pay

## 2021-10-09 ENCOUNTER — Ambulatory Visit (INDEPENDENT_AMBULATORY_CARE_PROVIDER_SITE_OTHER): Payer: Medicare Other | Admitting: Family Medicine

## 2021-10-09 VITALS — BP 130/67 | HR 71 | Resp 16 | Ht 69.0 in | Wt 166.0 lb

## 2021-10-09 DIAGNOSIS — Z298 Encounter for other specified prophylactic measures: Secondary | ICD-10-CM

## 2021-10-09 DIAGNOSIS — D500 Iron deficiency anemia secondary to blood loss (chronic): Secondary | ICD-10-CM

## 2021-10-09 DIAGNOSIS — R300 Dysuria: Secondary | ICD-10-CM

## 2021-10-09 DIAGNOSIS — J019 Acute sinusitis, unspecified: Secondary | ICD-10-CM

## 2021-10-09 LAB — POCT URINALYSIS DIP (CLINITEK)
Bilirubin, UA: NEGATIVE
Glucose, UA: NEGATIVE mg/dL
Nitrite, UA: POSITIVE — AB
POC PROTEIN,UA: 30 — AB
Spec Grav, UA: 1.025 (ref 1.010–1.025)
Urobilinogen, UA: 0.2 E.U./dL
pH, UA: 5.5 (ref 5.0–8.0)

## 2021-10-09 MED ORDER — SULFAMETHOXAZOLE-TRIMETHOPRIM 800-160 MG PO TABS: 1.0000 | ORAL_TABLET | Freq: Two times a day (BID) | ORAL | 0 refills | Status: DC

## 2021-10-09 NOTE — Progress Notes (Signed)
Acute Office Visit  Subjective:    Patient ID: Dennis Waller, male    DOB: [birth year], 71 y.o.   MRN: [id number]  Chief Complaint  Patient presents with   Urinary Tract Infection    Fever, chills, urinary discomfort several days     HPI Patient is in today for urinary sxs.  He says symptoms started yesterday with what felt like fevers and chills.  He says the urine looked red.  He had just completed antibiotics a week ago for UTI and now again.  He is just feeling really frustrated that he keeps getting urinary tract infections.  He did take some Tylenol and ibuprofen a couple of hours ago.  Last culture with urology a couple of weeks ago grew out Enterobacter.  It was sensitive to Bactrim.  Also complains of sinus headache and nasal congestion for about 6 weeks at this point as well.  Sinus problems or not necessarily new for him but he did want to mention it in case it could be causing some of the fever issues  From 09/18/2021 Component Value Ref Range Test Method Analysis Time Performed At Pathologist Signature  Urine Culture,Comprehensive  Final report (A)       [facility]    Result 1 Enterobacter cloacae (A)       [facility]    Comment: 10,000-25,000 colony forming units per mL  Antimicrobial Susceptibility Comment       [facility]    Comment:       ** S = Susceptible; I = Intermediate; R = Resistant **                   P = Positive; N = Negative            MICS are expressed in micrograms per mL   Antibiotic                 RSLT#1    RSLT#2    RSLT#3    RSLT#4 Amoxicillin/Clavulanic Acid    R Cefazolin                      R Cefepime                       S Cefuroxime                     R Ciprofloxacin                  S Ertapenem                      S Gentamicin                     S Imipenem                       S Levofloxacin                   S Meropenem                      S Nitrofurantoin                 I Tetracycline                   S Tobramycin  S Trimethoprim/Sulfa             S    Past Medical History:  Diagnosis Date   Chronic kidney disease    kidney stones   Hyperlipidemia     Past Surgical History:  Procedure Laterality Date   BACK SURGERY  1997   ? lumbar disc fusion   KIDNEY STONE SURGERY      Family History  Problem Relation Age of Onset   Hyperlipidemia Father    Hypertension Father    Diabetes Mother    Hypertension Mother    Cancer Mother        colon   COPD Mother     Social History   Socioeconomic History   Marital status: Married    Spouse name: diane   Number of children: 3   Years of education: 12   Highest education level: 12th grade  Occupational History   Occupation: retired    Comment: Public house manager - airplanes  Tobacco Use   Smoking status: Former    Packs/day: 1.00    Years: 55.00    Pack years: 55.00    Types: Cigarettes    Quit date: 11/09/2015    Years since quitting: 5.9   Smokeless tobacco: Never  Vaping Use   Vaping Use: Never used  Substance and Sexual Activity   Alcohol use: Yes    Comment: social drinker   Drug use: No   Sexual activity: Not Currently  Other Topics Concern   Not on file  Social History Narrative   In the band at church. Wants to get back in exercise routine but due to his arthritis he is not able to do stand for long periods of time. Enjoys playing his trombone. He enjoys reading and fishing.   Social Determinants of Health   Financial Resource Strain: Low Risk    Difficulty of Paying Living Expenses: Not hard at all  Food Insecurity: No Food Insecurity   Worried About Charity fundraiser in the Last Year: Never true   [place] in the Last Year: Never true  Transportation Needs: No Transportation Needs   Lack of Transportation (Medical): No   Lack of Transportation (Non-Medical): No  Physical Activity: Inactive   Days of Exercise per Week: 0 days   Minutes of Exercise per Session: 0 min  Stress: No Stress Concern  Present   Feeling of Stress : Not at all  Social Connections: Socially Integrated   Frequency of Communication with Friends and Family: Twice a week   Frequency of Social Gatherings with Friends and Family: Twice a week   Attends Religious Services: More than 4 times per year   Active Member of [employer] or Organizations: Yes   Attends Music therapist: More than 4 times per year   Marital Status: Married  Human resources officer Violence: Not At Risk   Fear of Current or Ex-Partner: No   Emotionally Abused: No   Physically Abused: No   Sexually Abused: No    Outpatient Medications Prior to Visit  Medication Sig Dispense Refill   acetaminophen (TYLENOL) 500 MG tablet Take 500 mg by mouth every 6 (six) hours as needed.     AMBULATORY NON FORMULARY MEDICATION Take 1 each by mouth daily. Medication Name: CBD gummy for sleep and arthritis     atorvastatin (LIPITOR) 40 MG tablet TAKE 1 TABLET BY MOUTH  DAILY 90 tablet 3   ibuprofen (ADVIL) 200 MG tablet Take 200 mg  by mouth every 6 (six) hours as needed.     oxyCODONE-acetaminophen (PERCOCET/ROXICET) 5-325 MG tablet PLEASE SEE ATTACHED FOR DETAILED DIRECTIONS (Patient not taking: Reported on 10/09/2021)     tamsulosin (FLOMAX) 0.4 MG CAPS capsule TAKE 1 CAPSULE BY MOUTH EVERYDAY AT BEDTIME (Patient not taking: Reported on 10/09/2021) 90 capsule 3   No facility-administered medications prior to visit.    No Known Allergies  Review of Systems     Objective:    Physical Exam Constitutional:      Appearance: He is well-developed.  HENT:     Head: Normocephalic and atraumatic.     Right Ear: External ear normal.     Left Ear: External ear normal.     Nose: Nose normal.  Eyes:     Conjunctiva/sclera: Conjunctivae normal.     Pupils: Pupils are equal, round, and reactive to light.  Neck:     Thyroid: No thyromegaly.  Cardiovascular:     Rate and Rhythm: Normal rate.     Heart sounds: Normal heart sounds.  Pulmonary:      Effort: Pulmonary effort is normal.     Breath sounds: Normal breath sounds.  Musculoskeletal:     Cervical back: Neck supple.  Lymphadenopathy:     Cervical: No cervical adenopathy.  Skin:    General: Skin is warm and dry.  Neurological:     Mental Status: He is alert and oriented to person, place, and time.    BP 130/67 (BP Location: Right Arm)    Pulse 71    Resp 16    Ht 5' 9"  (1.753 m)    Wt 166 lb (75.3 kg)    SpO2 99%    BMI 24.51 kg/m  Wt Readings from Last 3 Encounters:  10/09/21 166 lb (75.3 kg)  09/18/21 166 lb (75.3 kg)  07/05/21 179 lb (81.2 kg)    Health Maintenance Due  Topic Date Due   TETANUS/TDAP  03/08/2021   COLONOSCOPY (Pts 45-32yr Insurance coverage will need to be confirmed)  04/22/2021    There are no preventive care reminders to display for this patient.   Lab Results  Component Value Date   TSH 1.94 09/18/2021   Lab Results  Component Value Date   WBC 7.4 09/18/2021   HGB 9.9 (L) 09/18/2021   HCT 30.5 (L) 09/18/2021   MCV 95.3 09/18/2021   PLT 487 (H) 09/18/2021   Lab Results  Component Value Date   NA 136 09/18/2021   K 4.8 09/18/2021   CO2 26 09/18/2021   GLUCOSE 95 09/18/2021   BUN 15 09/18/2021   CREATININE 1.55 (H) 09/18/2021   BILITOT 0.4 07/17/2021   ALKPHOS 64 04/17/2016   AST 13 07/17/2021   ALT 9 07/17/2021   PROT 6.6 07/17/2021   ALBUMIN 4.6 04/17/2016   CALCIUM 9.0 09/18/2021   EGFR 47 (L) 09/18/2021   Lab Results  Component Value Date   CHOL 139 07/17/2021   Lab Results  Component Value Date   HDL 41 07/17/2021   Lab Results  Component Value Date   LDLCALC 72 07/17/2021   Lab Results  Component Value Date   TRIG 179 (H) 07/17/2021   Lab Results  Component Value Date   CHOLHDL 3.4 07/17/2021   Lab Results  Component Value Date   HGBA1C 5.6 12/29/2019       Assessment & Plan:   Problem List Items Addressed This Visit   None Visit Diagnoses     Dysuria    -  Primary   Relevant Orders    POCT URINALYSIS DIP (CLINITEK) (Completed)   CBC with Differential/Platelet   Urine Culture   Blood loss anemia       Relevant Orders   CBC with Differential/Platelet   Need for prophylaxis against urinary tract infection       Acute non-recurrent sinusitis, unspecified location       Relevant Medications   sulfamethoxazole-trimethoprim (BACTRIM DS) 800-160 MG tablet      Acute urinary tract infection-we will go ahead and treat with Bactrim and go to do an extended course since he did report a fever this morning.  We will send for culture.  He will have to provide [place] another specimen he brought in his own specimen from home.  Once he dropped that off then we can let him know if the antibiotic treatment is appropriate or not.  I based antibiotic choice today on his previous urine culture.  We did discuss possibly using prophylaxis after this acute treatment to try to reduce recurrence of infections.  We also discussed maybe getting a second opinion.  He will think about it.  Lead loss anemia-I would definitely like to recheck his hemoglobin today to make sure that it is improving.  He still seeing some blood in his stools.  Meds ordered this encounter  Medications   sulfamethoxazole-trimethoprim (BACTRIM DS) 800-160 MG tablet    Sig: Take 1 tablet by mouth 2 (two) times daily.    Dispense:  20 tablet    Refill:  0     Beatrice Lecher, MD

## 2021-10-10 DIAGNOSIS — R300 Dysuria: Secondary | ICD-10-CM | POA: Diagnosis not present

## 2021-10-10 LAB — CBC WITH DIFFERENTIAL/PLATELET
Absolute Monocytes: 1038 cells/uL — ABNORMAL HIGH (ref 200–950)
Basophils Absolute: 25 cells/uL (ref 0–200)
Basophils Relative: 0.2 %
Eosinophils Absolute: 75 cells/uL (ref 15–500)
Eosinophils Relative: 0.6 %
HCT: 30 % — ABNORMAL LOW (ref 38.5–50.0)
Hemoglobin: 9.8 g/dL — ABNORMAL LOW (ref 13.2–17.1)
Lymphs Abs: 2163 cells/uL (ref 850–3900)
MCH: 30.3 pg (ref 27.0–33.0)
MCHC: 32.7 g/dL (ref 32.0–36.0)
MCV: 92.9 fL (ref 80.0–100.0)
MPV: 8.9 fL (ref 7.5–12.5)
Monocytes Relative: 8.3 %
Neutro Abs: 9200 cells/uL — ABNORMAL HIGH (ref 1500–7800)
Neutrophils Relative %: 73.6 %
Platelets: 374 10*3/uL (ref 140–400)
RBC: 3.23 10*6/uL — ABNORMAL LOW (ref 4.20–5.80)
RDW: 12.5 % (ref 11.0–15.0)
Total Lymphocyte: 17.3 %
WBC: 12.5 10*3/uL — ABNORMAL HIGH (ref 3.8–10.8)

## 2021-10-10 NOTE — Progress Notes (Signed)
Hi Dennis Waller, your white count is definitely up meaning that your body is fighting an infection.  So hopefully you are up to pick up the antibiotic and start taking that yesterday.  You are also anemic.  Your hemoglobin is similar to what it was about 3 weeks ago but it is not improving.  I know you mentioned seeing blood in the toilet did you feel like it was coming more from the bladder or from your bowels?  Are you taking extra iron?

## 2021-10-11 NOTE — Progress Notes (Signed)
Okay, just wanted to check.  It is probably from the urine since she did have blood in the sample.  But just wanted to make sure that you are not seeing it coming from the bowels.   Definitely keep taking your iron.

## 2021-10-12 LAB — URINE CULTURE
MICRO NUMBER:: 13068462
SPECIMEN QUALITY:: ADEQUATE

## 2021-10-13 NOTE — Progress Notes (Signed)
Hi Dennis Waller, urine culture shows Enterobacter.  Your last urine culture grew out Enterobacter as well.  The prescription that I put you on should work.  I went ahead and gave you 10 days when I saw you so lets just finish that out and then once you have been off of it for a week then I want to recheck a urine culture to make sure that we have cleared it up.  Hopefully , you are feeling a lot better.

## 2021-10-24 ENCOUNTER — Other Ambulatory Visit: Payer: Self-pay

## 2021-10-24 ENCOUNTER — Ambulatory Visit: Payer: Medicare Other

## 2021-10-24 DIAGNOSIS — N3001 Acute cystitis with hematuria: Secondary | ICD-10-CM

## 2021-10-25 LAB — URINE CULTURE
MICRO NUMBER:: 13128871
Result:: NO GROWTH
SPECIMEN QUALITY:: ADEQUATE

## 2021-10-26 NOTE — Progress Notes (Signed)
Hi John, urine culture is negative no sign of UTI.  Just a reminder that you are due for your 10-year follow-up for colonoscopy.  So the tetanus is now 100% covered by Medicare at your pharmacy as of January 1 it is free!

## 2021-11-02 DIAGNOSIS — Z800 Family history of malignant neoplasm of digestive organs: Secondary | ICD-10-CM | POA: Diagnosis not present

## 2021-11-02 DIAGNOSIS — K5730 Diverticulosis of large intestine without perforation or abscess without bleeding: Secondary | ICD-10-CM | POA: Diagnosis not present

## 2021-11-02 DIAGNOSIS — D125 Benign neoplasm of sigmoid colon: Secondary | ICD-10-CM | POA: Diagnosis not present

## 2021-11-02 DIAGNOSIS — Z1211 Encounter for screening for malignant neoplasm of colon: Secondary | ICD-10-CM | POA: Diagnosis not present

## 2021-11-02 LAB — HM COLONOSCOPY

## 2021-11-09 ENCOUNTER — Encounter: Payer: Self-pay | Admitting: Family Medicine

## 2021-11-10 ENCOUNTER — Ambulatory Visit (INDEPENDENT_AMBULATORY_CARE_PROVIDER_SITE_OTHER): Payer: Medicare Other | Admitting: Family Medicine

## 2021-11-10 DIAGNOSIS — Z0000 Encounter for general adult medical examination without abnormal findings: Secondary | ICD-10-CM

## 2021-11-10 NOTE — Patient Instructions (Addendum)
?MEDICARE ANNUAL WELLNESS VISIT ?Health Maintenance Summary and Written Plan of Care ? ?Mr. Ancheta , ? ?Thank you for allowing me to perform your Medicare Annual Wellness Visit and for your ongoing commitment to your health.  ? ?Health Maintenance & Immunization History ?Health Maintenance  ?Topic Date Due  ?? TETANUS/TDAP  11/11/2022 (Originally 03/08/2021)  ?? COLONOSCOPY (Pts 45-5yr Insurance coverage will need to be confirmed)  11/03/2026  ?? Pneumonia Vaccine 71 Years old  Completed  ?? INFLUENZA VACCINE  Completed  ?? COVID-19 Vaccine  Completed  ?? Hepatitis C Screening  Completed  ?? Zoster Vaccines- Shingrix  Completed  ?? HPV VACCINES  Aged Out  ? ?Immunization History  ?Administered Date(s) Administered  ?? Fluad Quad(high Dose 65+) 07/01/2019, 06/10/2020, 07/05/2021  ?? Influenza, High Dose Seasonal PF 06/12/2017  ?? Influenza,inj,Quad PF,6+ Mos 07/02/2013, 06/11/2014, 10/17/2015, 04/17/2016, 05/28/2018  ?? PFIZER(Purple Top)SARS-COV-2 Vaccination 10/02/2019, 10/27/2019, 06/07/2020  ?? PPension scheme manager142yr& up 06/23/2021  ?? Pneumococcal Conjugate-13 06/11/2014  ?? Pneumococcal Polysaccharide-23 08/13/2004, 10/17/2015  ?? Tdap 03/09/2011  ?? Zoster Recombinat (Shingrix) 07/15/2019, 09/16/2019  ?? Zoster, Live 03/09/2011  ? ? ?These are the patient goals that we discussed: ? Goals Addressed   ?  ?  ?  ?  ?  ? This Visit's Progress  ? ?  Patient Stated (pt-stated)     ?   Continue to try to be more active and stay healthy. ?  ?  ?  ? ?This is a list of Health Maintenance Items that are overdue or due now: ?Td vaccine ? ?Orders/Referrals Placed Today: ?No orders of the defined types were placed in this encounter. ? ?(Contact our referral department at [phone] you have not spoken with someone about your referral appointment within the next 5 days)  ? ? ?Follow-up Plan ?Follow-up with MeHali MarryMD as planned ?Schedule your tetanus shot at your pharmacy and  your eye exam. ?Medicare wellness visit in one year. ?Patient will access AVS on my chart. ? ? ? ?  ?Health Maintenance, Male ?Adopting a healthy lifestyle and getting preventive care are important in promoting health and wellness. Ask your health care provider about: ?The right schedule for you to have regular tests and exams. ?Things you can do on your own to prevent diseases and keep yourself healthy. ?What should I know about diet, weight, and exercise? ?Eat a healthy diet ? ?Eat a diet that includes plenty of vegetables, fruits, low-fat dairy products, and lean protein. ?Do not eat a lot of foods that are high in solid fats, added sugars, or sodium. ?Maintain a healthy weight ?Body mass index (BMI) is a measurement that can be used to identify possible weight problems. It estimates body fat based on height and weight. Your health care provider can help determine your BMI and help you achieve or maintain a healthy weight. ?Get regular exercise ?Get regular exercise. This is one of the most important things you can do for your health. Most adults should: ?Exercise for at least 150 minutes each week. The exercise should increase your heart rate and make you sweat (moderate-intensity exercise). ?Do strengthening exercises at least twice a week. This is in addition to the moderate-intensity exercise. ?Spend less time sitting. Even light physical activity can be beneficial. ?Watch cholesterol and blood lipids ?Have your blood tested for lipids and cholesterol at 2091ears of age, then have this test every 5 years. ?You may need to have your cholesterol levels checked more  often if: ?Your lipid or cholesterol levels are high. ?You are older than 73 years of age. ?You are at high risk for heart disease. ?What should I know about cancer screening? ?Many types of cancers can be detected early and may often be prevented. Depending on your health history and family history, you may need to have cancer screening at various  ages. This may include screening for: ?Colorectal cancer. ?Prostate cancer. ?Skin cancer. ?Lung cancer. ?What should I know about heart disease, diabetes, and high blood pressure? ?Blood pressure and heart disease ?High blood pressure causes heart disease and increases the risk of stroke. This is more likely to develop in people who have high blood pressure readings or are overweight. ?Talk with your health care provider about your target blood pressure readings. ?Have your blood pressure checked: ?Every 3-5 years if you are 45-13 years of age. ?Every year if you are 83 years old or older. ?If you are between the ages of 54 and 17 and are a current or former smoker, ask your health care provider if you should have a one-time screening for abdominal aortic aneurysm (AAA). ?Diabetes ?Have regular diabetes screenings. This checks your fasting blood sugar level. Have the screening done: ?Once every three years after age 53 if you are at a normal weight and have a low risk for diabetes. ?More often and at a younger age if you are overweight or have a high risk for diabetes. ?What should I know about preventing infection? ?Hepatitis B ?If you have a higher risk for hepatitis B, you should be screened for this virus. Talk with your health care provider to find out if you are at risk for hepatitis B infection. ?Hepatitis C ?Blood testing is recommended for: ?Everyone born from [birth year] through 1967. ?Anyone with known risk factors for hepatitis C. ?Sexually transmitted infections (STIs) ?You should be screened each year for STIs, including gonorrhea and chlamydia, if: ?You are sexually active and are younger than 73 years of age. ?You are older than 73 years of age and your health care provider tells you that you are at risk for this type of infection. ?Your sexual activity has changed since you were last screened, and you are at increased risk for chlamydia or gonorrhea. Ask your health care provider if you are at risk. ?Ask  your health care provider about whether you are at high risk for HIV. Your health care provider may recommend a prescription medicine to help prevent HIV infection. If you choose to take medicine to prevent HIV, you should first get tested for HIV. You should then be tested every 3 months for as long as you are taking the medicine. ?Follow these instructions at home: ?Alcohol use ?Do not drink alcohol if your health care provider tells you not to drink. ?If you drink alcohol: ?Limit how much you have to 0-2 drinks a day. ?Know how much alcohol is in your drink. In the [place] one drink equals one 12 oz bottle of beer (355 mL), one 5 oz glass of wine (148 mL), or one 1? oz glass of hard liquor (44 mL). ?Lifestyle ?Do not use any products that contain nicotine or tobacco. These products include cigarettes, chewing tobacco, and vaping devices, such as e-cigarettes. If you need help quitting, ask your health care provider. ?Do not use street drugs. ?Do not share needles. ?Ask your health care provider for help if you need support or information about quitting drugs. ?General instructions ?Schedule regular health, dental, and eye  exams. ?Stay current with your vaccines. ?Tell your health care provider if: ?You often feel depressed. ?You have ever been abused or do not feel safe at home. ?Summary ?Adopting a healthy lifestyle and getting preventive care are important in promoting health and wellness. ?Follow your health care provider's instructions about healthy diet, exercising, and getting tested or screened for diseases. ?Follow your health care provider's instructions on monitoring your cholesterol and blood pressure. ?This information is not intended to replace advice given to you by your health care provider. Make sure you discuss any questions you have with your health care provider. ?Document Revised: 12/19/2020 Document Reviewed: 12/19/2020 ?Elsevier Patient Education ? [place]. ? ?

## 2021-11-10 NOTE — Progress Notes (Signed)
? ? ?MEDICARE ANNUAL WELLNESS VISIT ? ?11/10/2021 ? ?Telephone Visit Disclaimer ?This Medicare AWV was conducted by telephone due to national recommendations for restrictions regarding the COVID-19 Pandemic (e.g. social distancing).  I verified, using two identifiers, that I am speaking with Dennis Waller or their authorized healthcare agent. I discussed the limitations, risks, security, and privacy concerns of performing an evaluation and management service by telephone and the potential availability of an in-person appointment in the future. The patient expressed understanding and agreed to proceed.  ?Location of Patient: Home  ?Location of Provider (nurse):  In the office. ? ?Subjective:  ? ? ?Dennis Waller is a 71 y.o. male patient of Waller, Dennis Kocher, MD who had a Medicare Annual Wellness Visit today via telephone. Dennis Waller is Retired and lives with their spouse. he has 3 children. he reports that he is socially active and does interact with friends/family regularly. he is minimally physically active and enjoys playing his trombone, reading and fishing. ? ?Patient Care Team: ?Dennis Marry, MD as PCP - General ? ? ?  11/10/2021  ?  9:50 AM 11/04/2020  ?  9:03 AM 05/28/2018  ? 11:04 AM 06/11/2014  ?  1:16 PM  ?Advanced Directives  ?Does Patient Have a Medical Advance Directive? Yes No Yes No  ?Type of Advance Directive Living will  [place];Out of facility DNR (pink MOST or yellow form);Living will   ?Does patient want to make changes to medical advance directive? No - Patient declined  No - Patient declined   ?Copy of [place] in Chart?   No - copy requested   ?Would patient like information on creating a medical advance directive?  No - Patient declined  No - patient declined information  ? ? ?Hospital Utilization Over the Past 12 Months: ?# of hospitalizations or ER visits: 1 ?# of surgeries: 3 ? ?Review of Systems    ?Patient reports that his overall  health is better compared to last year. ? ?History obtained from chart review and the patient ? ?Patient Reported Readings (BP, Pulse, CBG, Weight, etc) ?none ? ?Pain Assessment ?Pain : No/denies pain ? ?  ? ?Current Medications & Allergies (verified) ?Allergies as of 11/10/2021   ?No Known Allergies ?  ? ?  ?Medication List  ?  ? ?  ? Accurate as of November 10, 2021  9:51 AM. If you have any questions, ask your nurse or doctor.  ?  ?  ? ?  ? ?acetaminophen 500 MG tablet ?Commonly known as: TYLENOL ?Take 500 mg by mouth every 6 (six) hours as needed. ?  ?AMBULATORY NON FORMULARY MEDICATION ?Take 1 each by mouth daily. Medication Name: CBD gummy for sleep and arthritis ?  ?atorvastatin 40 MG tablet ?Commonly known as: LIPITOR ?TAKE 1 TABLET BY MOUTH  DAILY ?  ?FERROUS SULFATE IRON PO ?Take by mouth. ?  ?ibuprofen 200 MG tablet ?Commonly known as: ADVIL ?Take 200 mg by mouth every 6 (six) hours as needed. ?  ?oxyCODONE-acetaminophen 5-325 MG tablet ?Commonly known as: PERCOCET/ROXICET ?PLEASE SEE ATTACHED FOR DETAILED DIRECTIONS ?  ?sulfamethoxazole-trimethoprim 800-160 MG tablet ?Commonly known as: BACTRIM DS ?Take 1 tablet by mouth 2 (two) times daily. ?  ?tamsulosin 0.4 MG Caps capsule ?Commonly known as: FLOMAX ?TAKE 1 CAPSULE BY MOUTH EVERYDAY AT BEDTIME ?  ?VITAMIN B 12 PO ?Take by mouth. ?  ? ?  ? ? ?History (reviewed): ?Past Medical History:  ?Diagnosis Date  ? Anemia check  with Dr. Suzi Waller  ? Arthritis years ago  ? Chronic kidney disease   ? kidney stones  ? GERD (gastroesophageal reflux disease) It returned while I was in the hospital  Zantac has relieved the symptons  ? Hyperlipidemia   ? Neuromuscular disorder ([place]) years  ? neuropathy in legs and feet  ? ?Past Surgical History:  ?Procedure Laterality Date  ? BACK SURGERY  08/14/1995  ? ? lumbar disc fusion  ? FRACTURE SURGERY  50yr ago  ? broken nose  ? KIDNEY STONE SURGERY    ? SPINE SURGERY  40 years ago had a bulging disc problem taken care of  ? ?Family  History  ?Problem Relation Age of Onset  ? Hyperlipidemia Father   ? Hypertension Father   ? Arthritis Father   ? Diabetes Mother   ? Hypertension Mother   ? Cancer Mother   ?     colon  ? COPD Mother   ? Arthritis Brother   ? Diabetes Sister   ? ?Social History  ? ?Socioeconomic History  ? Marital status: Married  ?  Spouse name: diane  ? Number of children: 3  ? Years of education: 126 ? Highest education level: 12th grade  ?Occupational History  ? Occupation: retired  ?  Comment: OPublic house manager- [employer]  ?Tobacco Use  ? Smoking status: Former  ?  Packs/day: 2.00  ?  Years: 55.00  ?  Pack years: 110.00  ?  Types: Cigarettes  ?  Quit date: 11/09/2015  ?  Years since quitting: 6.0  ? Smokeless tobacco: Never  ?Vaping Use  ? Vaping Use: Never used  ?Substance and Sexual Activity  ? Alcohol use: Not Currently  ?  Comment: very seldom  ? Drug use: No  ? Sexual activity: Not Currently  ?Other Topics Concern  ? Not on file  ?Social History Narrative  ? Lives with his wife. In the band at church. Wants to get back in exercise routine but due to his arthritis he is not able to do stand for long periods of time. Enjoys playing his trombone. He enjoys reading and fishing.  ? ?Social Determinants of Health  ? ?Financial Resource Strain: Low Risk   ? Difficulty of Paying Living Expenses: Not very hard  ?Food Insecurity: No Food Insecurity  ? Worried About RCharity fundraiserin the Last Year: Never true  ? Ran Out of Food in the Last Year: Never true  ?Transportation Needs: No Transportation Needs  ? Lack of Transportation (Medical): No  ? Lack of Transportation (Non-Medical): No  ?Physical Activity: Inactive  ? Days of Exercise per Week: 0 days  ? Minutes of Exercise per Session: 0 min  ?Stress: No Stress Concern Present  ? Feeling of Stress : Only a little  ?Social Connections: Socially Integrated  ? Frequency of Communication with Friends and Family: More than three times a week  ? Frequency of Social Gatherings  with Friends and Family: More than three times a week  ? Attends Religious Services: More than 4 times per year  ? Active Member of Clubs or Organizations: Yes  ? Attends CArchivistMeetings: More than 4 times per year  ? Marital Status: Married  ? ? ?Activities of Daily Living ? ?  11/06/2021  ? 11:57 AM  ?In your present state of health, do you have any difficulty performing the following activities:  ?Hearing? 0  ?Vision? 0  ?Difficulty concentrating or making decisions? 0  ?  Walking or climbing stairs? 0  ?Dressing or bathing? 0  ?Doing errands, shopping? 0  ?Preparing Food and eating ? N  ?Using the Toilet? N  ?In the past six months, have you accidently leaked urine? Y  ?Comment has noticed- post void dribble  ?Do you have problems with loss of bowel control? N  ?Managing your Medications? N  ?Managing your Finances? N  ?Housekeeping or managing your Housekeeping? N  ? ? ?Patient Education/ Literacy ?How often do you need to have someone help you when you read instructions, pamphlets, or other written materials from your doctor or pharmacy?: 1 - Never ?What is the last grade level you completed in school?: 12th grade ? ?Exercise ?Current Exercise Habits: The patient does not participate in regular exercise at present, Exercise limited by: None identified ? ?Diet ?Patient reports consuming 2 meals a day and 2-3 snack(s) a day ?Patient reports that his primary diet is: Regular ?Patient reports that she does have regular access to food.  ? ?Depression Screen ? ?  11/10/2021  ?  9:36 AM 09/18/2021  ?  7:34 AM 11/04/2020  ?  9:06 AM 07/01/2019  ? 10:00 AM 12/18/2018  ?  9:43 AM 05/28/2018  ? 11:05 AM 06/12/2017  ?  2:11 PM  ?PHQ 2/9 Scores  ?PHQ - 2 Score 0 1 0 0 0 0 0  ?  ? ?Fall Risk ? ?  11/06/2021  ? 11:57 AM 09/18/2021  ?  7:33 AM 11/04/2020  ?  9:03 AM 07/01/2019  ? 10:00 AM 05/28/2018  ? 11:05 AM  ?Fall Risk   ?Falls in the past year? '1 1 1 '$ 0 No  ?Number falls in past yr: 0 0 0 0   ?Injury with Fall? 0 0 1 0    ?Comment   hurt his toe    ?Risk for fall due to : No Fall Risks Other (Comment) No Fall Risks    ?Risk for fall due to: Comment  slipped on hands and knees after having surgery     ?Follow up Falls ev

## 2022-01-16 ENCOUNTER — Ambulatory Visit (INDEPENDENT_AMBULATORY_CARE_PROVIDER_SITE_OTHER): Payer: Medicare Other | Admitting: Family Medicine

## 2022-01-16 ENCOUNTER — Encounter: Payer: Self-pay | Admitting: Family Medicine

## 2022-01-16 VITALS — BP 136/81 | HR 62 | Ht 69.0 in | Wt 162.0 lb

## 2022-01-16 DIAGNOSIS — R7989 Other specified abnormal findings of blood chemistry: Secondary | ICD-10-CM | POA: Diagnosis not present

## 2022-01-16 DIAGNOSIS — E611 Iron deficiency: Secondary | ICD-10-CM | POA: Diagnosis not present

## 2022-01-16 DIAGNOSIS — J439 Emphysema, unspecified: Secondary | ICD-10-CM | POA: Diagnosis not present

## 2022-01-16 DIAGNOSIS — D500 Iron deficiency anemia secondary to blood loss (chronic): Secondary | ICD-10-CM | POA: Diagnosis not present

## 2022-01-16 NOTE — Assessment & Plan Note (Signed)
Stable

## 2022-01-16 NOTE — Progress Notes (Signed)
   Established Patient Office Visit  Subjective   Patient ID: Dennis Waller, male    DOB: [birth year]  Age: 71 y.o. MRN: [id number]  Chief Complaint  Patient presents with   Follow-up    HPI  Here for f/u from blood loss anemia.  See prior note.  He has been taking an iron supplement and a probiotic and feels much much better he says he feels like he is back to 100% in regards to energy levels.  In fact he says he was able to put in a new mailbox yesterday and did go hole in the yard without significant difficulty though he did feel little tired afterwards.  He did have his colonoscopy in March.  Serum creatinine was mildly elevated on his labs in February as well and he is due to recheck those.  COPD-no recent flares or exacerbations.    ROS    Objective:     BP 136/81   Pulse 62   Ht '5\' 9"'$  (1.753 m)   Wt 162 lb (73.5 kg)   BMI 23.92 kg/m    Physical Exam Constitutional:      Appearance: He is well-developed.  HENT:     Head: Normocephalic and atraumatic.  Cardiovascular:     Rate and Rhythm: Normal rate and regular rhythm.     Heart sounds: Normal heart sounds.  Pulmonary:     Effort: Pulmonary effort is normal.     Breath sounds: Normal breath sounds.  Skin:    General: Skin is warm and dry.  Neurological:     Mental Status: He is alert and oriented to person, place, and time.  Psychiatric:        Behavior: Behavior normal.     No results found for any visits on 01/16/22.    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        Other Visit Diagnoses     Blood loss anemia       Relevant Orders   BASIC METABOLIC PANEL WITH GFR   CBC   Fe+TIBC+Fer   Elevated serum creatinine       Relevant Orders   BASIC METABOLIC PANEL WITH GFR   CBC   Fe+TIBC+Fer    Low iron       Relevant Orders   BASIC METABOLIC PANEL WITH GFR   CBC   Fe+TIBC+Fer      Anemia - feels much better after beingon iron for 3 months. Recheck labs today.  Elevated Serum Cr. Recheck labs.    Return in about 6 months (around 07/18/2022) for COPD.    Beatrice Lecher, MD

## 2022-01-17 LAB — BASIC METABOLIC PANEL WITH GFR
BUN/Creatinine Ratio: 10 (calc) (ref 6–22)
BUN: 15 mg/dL (ref 7–25)
CO2: 23 mmol/L (ref 20–32)
Calcium: 9.1 mg/dL (ref 8.6–10.3)
Chloride: 107 mmol/L (ref 98–110)
Creat: 1.57 mg/dL — ABNORMAL HIGH (ref 0.70–1.28)
Glucose, Bld: 91 mg/dL (ref 65–99)
Potassium: 3.9 mmol/L (ref 3.5–5.3)
Sodium: 139 mmol/L (ref 135–146)
eGFR: 47 mL/min/{1.73_m2} — ABNORMAL LOW (ref >=60)

## 2022-01-17 LAB — CBC
HCT: 32.2 % — ABNORMAL LOW (ref 38.5–50.0)
Hemoglobin: 10.7 g/dL — ABNORMAL LOW (ref 13.2–17.1)
MCH: 30.2 pg (ref 27.0–33.0)
MCHC: 33.2 g/dL (ref 32.0–36.0)
MCV: 91 fL (ref 80.0–100.0)
MPV: 8.9 fL (ref 7.5–12.5)
Platelets: 261 10*3/uL (ref 140–400)
RBC: 3.54 10*6/uL — ABNORMAL LOW (ref 4.20–5.80)
RDW: 14.8 % (ref 11.0–15.0)
WBC: 6.8 10*3/uL (ref 3.8–10.8)

## 2022-01-17 LAB — IRON,TIBC AND FERRITIN PANEL
%SAT: 22 % (calc) (ref 20–48)
Ferritin: 28 ng/mL (ref 24–380)
Iron: 85 ug/dL (ref 50–180)
TIBC: 386 mcg/dL (calc) (ref 250–425)

## 2022-01-17 NOTE — Progress Notes (Signed)
Hi Dennis Waller, kidney function is stable at 1.5.  Hemoglobin does look better at 10.7.  Still on the low end of normal but it is better than it was several months ago.  Total iron stores looking a little better.  Not quite where I would like to see it.  For now continue with your supplement and then I like to recheck your level again in 3 months.

## 2022-01-17 NOTE — Progress Notes (Signed)
Hi Don, please excuse my dictation software on the previous message.  It auto corrected to [place].  I apologize I did not catch it and until I was hitting send.

## 2022-01-31 ENCOUNTER — Other Ambulatory Visit: Payer: Self-pay | Admitting: Family Medicine

## 2022-01-31 DIAGNOSIS — E785 Hyperlipidemia, unspecified: Secondary | ICD-10-CM

## 2022-05-08 DIAGNOSIS — L820 Inflamed seborrheic keratosis: Secondary | ICD-10-CM | POA: Diagnosis not present

## 2022-05-08 DIAGNOSIS — L814 Other melanin hyperpigmentation: Secondary | ICD-10-CM | POA: Diagnosis not present

## 2022-05-29 ENCOUNTER — Ambulatory Visit (INDEPENDENT_AMBULATORY_CARE_PROVIDER_SITE_OTHER): Payer: Medicare Other

## 2022-05-29 DIAGNOSIS — Z87891 Personal history of nicotine dependence: Secondary | ICD-10-CM | POA: Diagnosis not present

## 2022-05-31 ENCOUNTER — Other Ambulatory Visit: Payer: Self-pay

## 2022-05-31 DIAGNOSIS — Z87891 Personal history of nicotine dependence: Secondary | ICD-10-CM

## 2022-05-31 DIAGNOSIS — Z122 Encounter for screening for malignant neoplasm of respiratory organs: Secondary | ICD-10-CM

## 2022-06-29 IMAGING — CT CT CHEST LUNG CANCER SCREENING LOW DOSE W/O CM
2 of 4 series · 15 of 36 positions shown, 18 images · non-contrast
Comparison: Low-dose lung cancer screening chest CT 05/08/2019.

CLINICAL DATA: 69-year-old male former smoker (quit in 1376) with
55 pack-year history of smoking. Lung cancer screening examination.

EXAM:
CT CHEST WITHOUT CONTRAST LOW-DOSE FOR LUNG CANCER SCREENING
TECHNIQUE: Multidetector CT imaging of the chest was performed following the
standard protocol without IV contrast.

[Series 4: lungs · axial · 0.73mm/px · z∈[-342,-48]mm · 12 of 324 slices shown, 15 images]
[im 15/324  mediastinal]
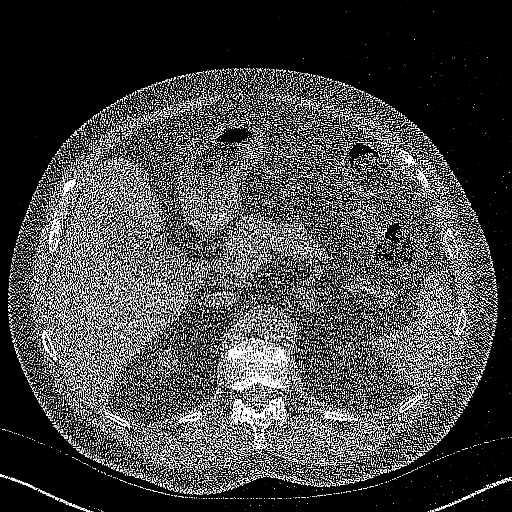
[im 15/324  lung]
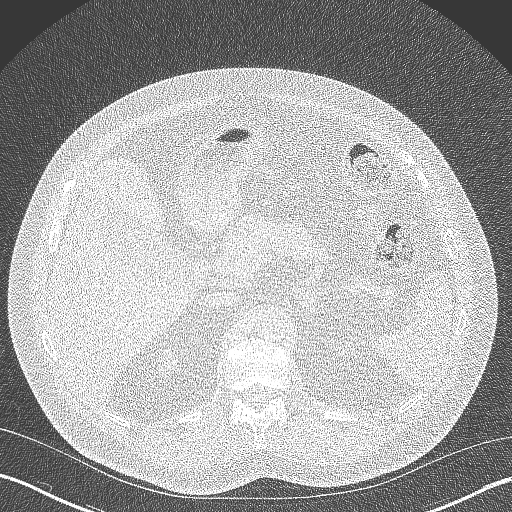
[im 45/324  lung]
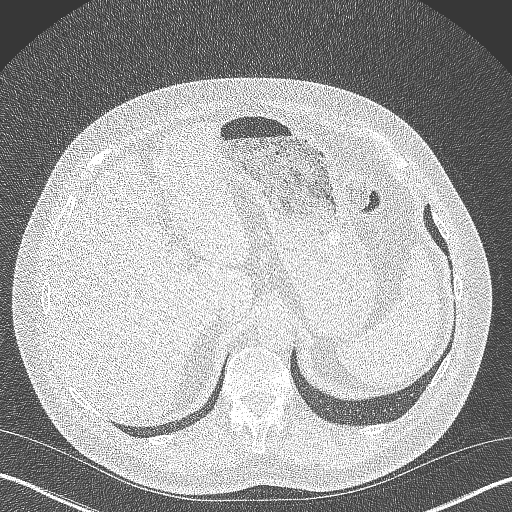
[im 74/324  lung]
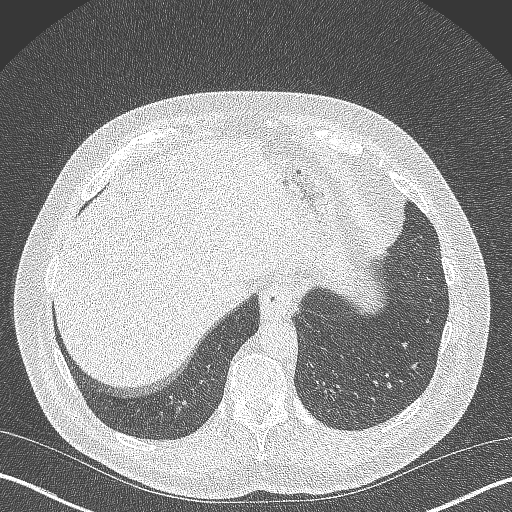
[im 103/324  lung]
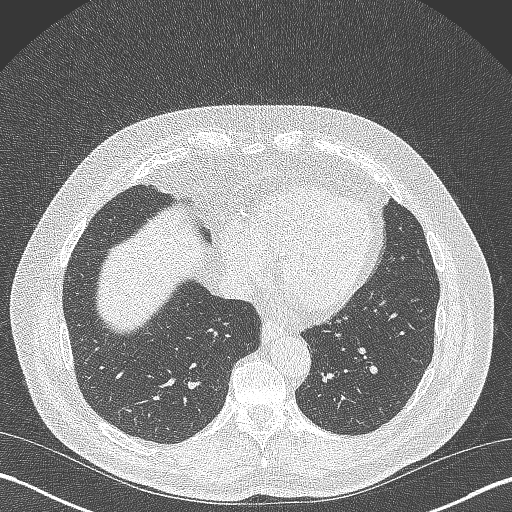
[im 118/324  mediastinal]
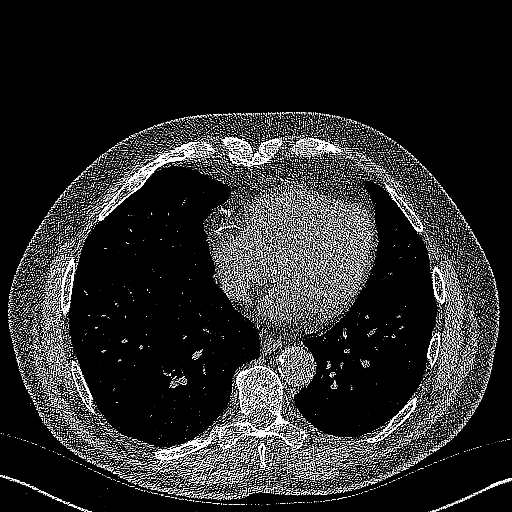
[im 118/324  lung]
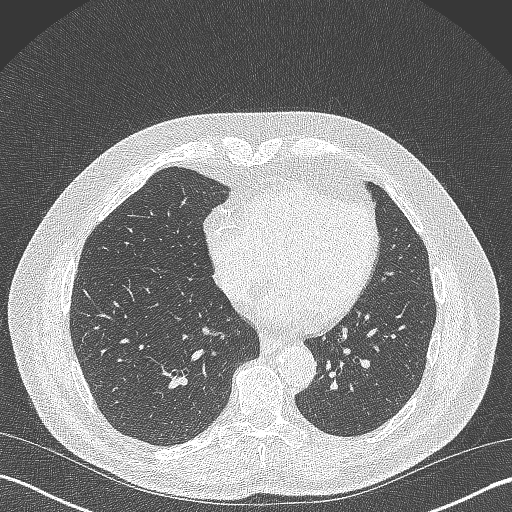
[im 147/324  lung]
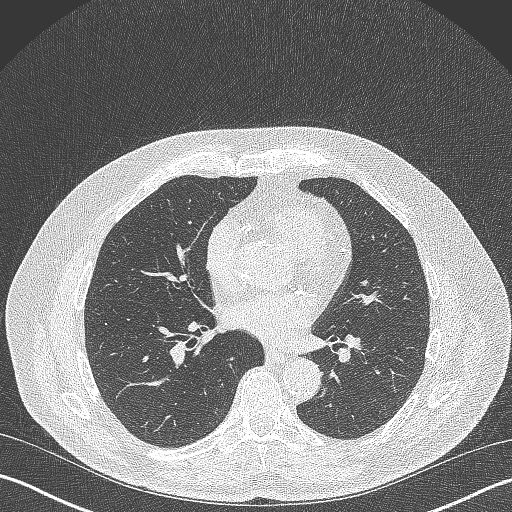
[im 177/324  lung]
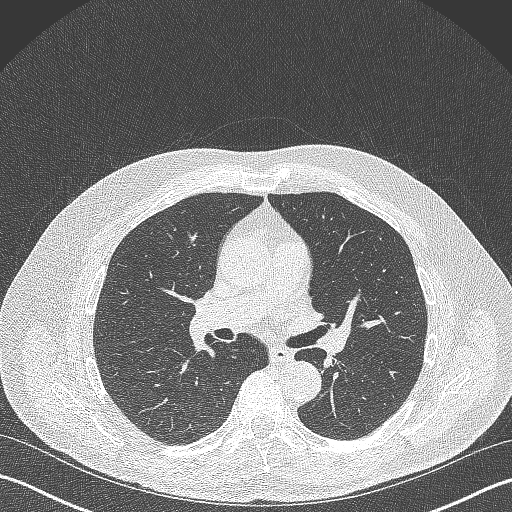
[im 206/324  lung]
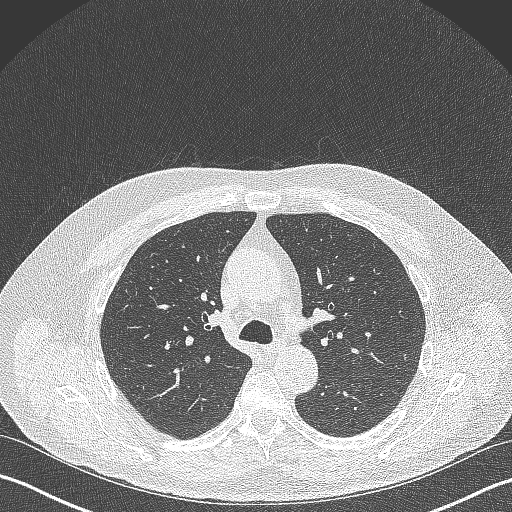
[im 221/324  mediastinal]
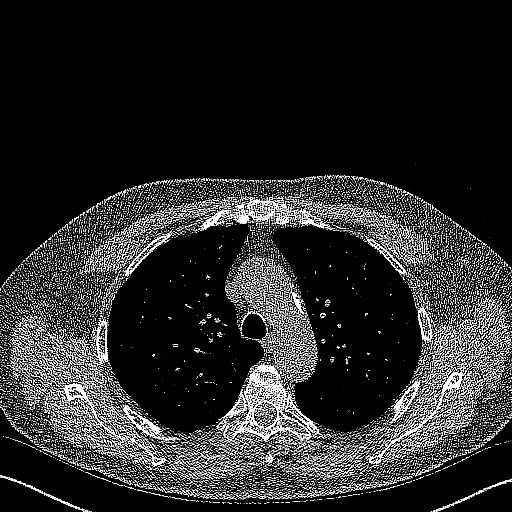
[im 221/324  lung]
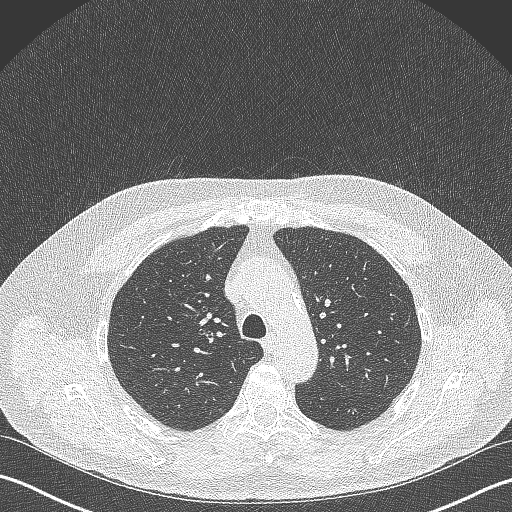
[im 250/324  lung]
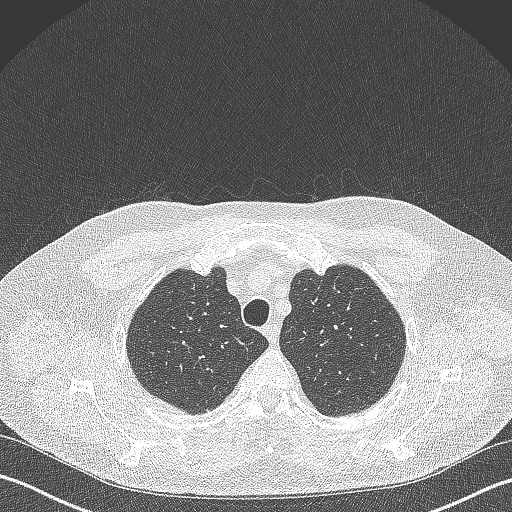
[im 279/324  lung]
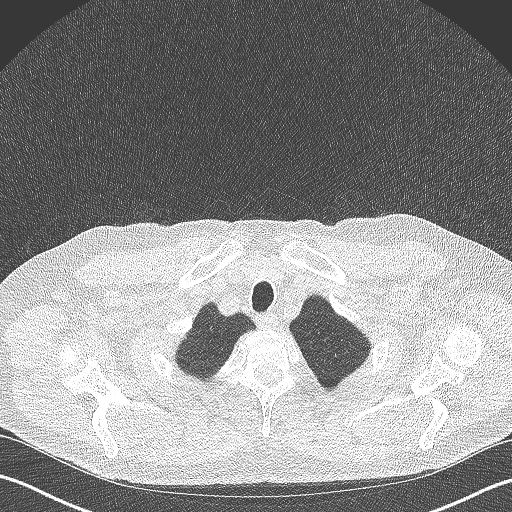
[im 309/324  lung]
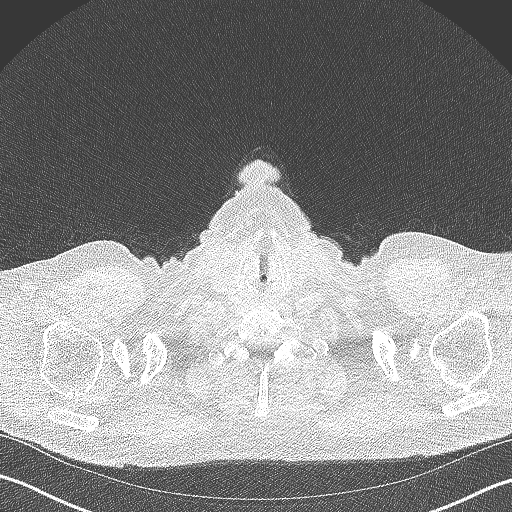

[Series 5: coronal · coronal · 0.66mm/px · 3 of 292 slices shown]
[im 59/292  lung]
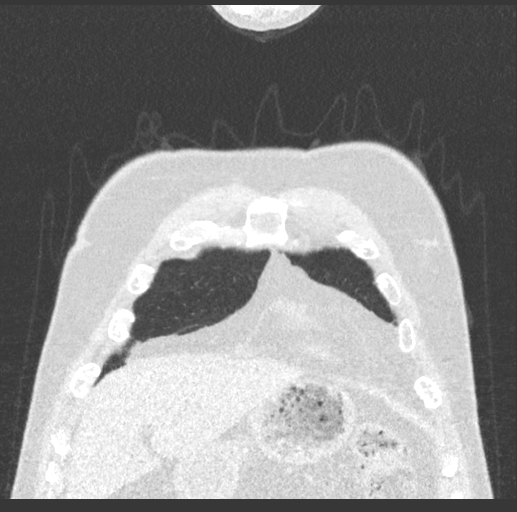
[im 117/292  lung]
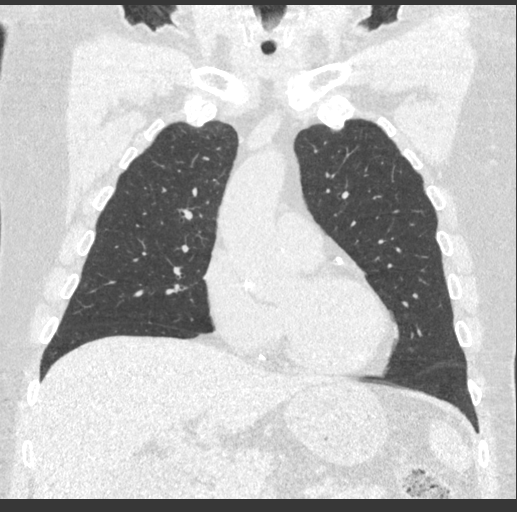
[im 175/292  lung]
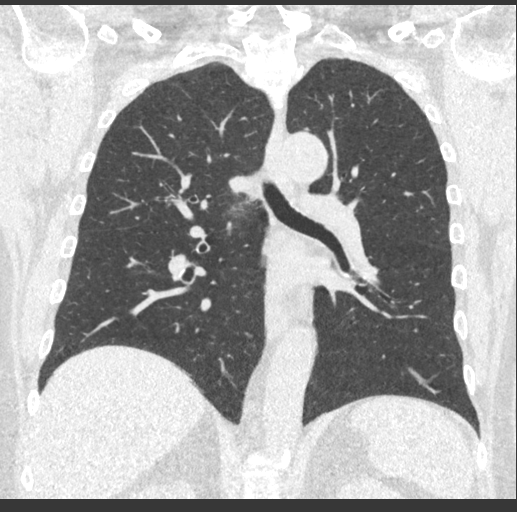

[15 of 36 positions shown; findings below may reference images not displayed]

FINDINGS: Cardiovascular: Heart size is normal. There is no significant
pericardial fluid, thickening or pericardial calcification. There is
aortic atherosclerosis, as well as atherosclerosis of the great
vessels of the mediastinum and the coronary arteries, including
calcified atherosclerotic plaque in the left anterior descending,
left circumflex and right coronary arteries.

Mediastinum/Nodes: No pathologically enlarged mediastinal or hilar
lymph nodes. Please note that accurate exclusion of hilar adenopathy
is limited on noncontrast CT scans. Esophagus is unremarkable in
appearance. No axillary lymphadenopathy.

Lungs/Pleura: Tiny pulmonary nodule in the periphery of the left
upper lobe (axial image 89 of series 3), with a volume derived mean
diameter of 3.1 mm. No other larger more suspicious appearing
pulmonary nodules or masses are noted. No acute consolidative
airspace disease. No pleural effusions. Mild diffuse bronchial wall
thickening with mild centrilobular and paraseptal emphysema.

Upper Abdomen: Aortic atherosclerosis.

Musculoskeletal: There are no aggressive appearing lytic or blastic
lesions noted in the visualized portions of the skeleton.
IMPRESSION: 1. Lung-RADS 2S, benign appearance or behavior. Continue annual
screening with low-dose chest CT without contrast in 12 months.
2. The "S" modifier above refers to potentially clinically
significant non lung cancer related findings. Specifically, there is
aortic atherosclerosis, in addition to 3 vessel coronary artery
disease. Please note that although the presence of coronary artery
calcium documents the presence of coronary artery disease, the
severity of this disease and any potential stenosis cannot be
assessed on this non-gated CT examination. Assessment for potential
risk factor modification, dietary therapy or pharmacologic therapy
may be warranted, if clinically indicated.
3. Mild diffuse bronchial wall thickening with mild centrilobular
and paraseptal emphysema; imaging findings suggestive of underlying
COPD.

Aortic Atherosclerosis (PSEEC-38U.U) and Emphysema (PSEEC-NFH.2).

## 2022-07-02 ENCOUNTER — Ambulatory Visit (INDEPENDENT_AMBULATORY_CARE_PROVIDER_SITE_OTHER): Payer: Medicare Other | Admitting: Family Medicine

## 2022-07-02 DIAGNOSIS — Z23 Encounter for immunization: Secondary | ICD-10-CM | POA: Diagnosis not present

## 2022-09-13 ENCOUNTER — Ambulatory Visit (INDEPENDENT_AMBULATORY_CARE_PROVIDER_SITE_OTHER): Payer: Medicare Other | Admitting: Family Medicine

## 2022-09-13 ENCOUNTER — Encounter: Payer: Self-pay | Admitting: Family Medicine

## 2022-09-13 VITALS — BP 164/68 | HR 69 | Ht 69.0 in | Wt 168.0 lb

## 2022-09-13 DIAGNOSIS — R790 Abnormal level of blood mineral: Secondary | ICD-10-CM

## 2022-09-13 DIAGNOSIS — H6122 Impacted cerumen, left ear: Secondary | ICD-10-CM | POA: Diagnosis not present

## 2022-09-13 DIAGNOSIS — D649 Anemia, unspecified: Secondary | ICD-10-CM

## 2022-09-13 DIAGNOSIS — H918X2 Other specified hearing loss, left ear: Secondary | ICD-10-CM

## 2022-09-13 DIAGNOSIS — J439 Emphysema, unspecified: Secondary | ICD-10-CM

## 2022-09-13 DIAGNOSIS — E785 Hyperlipidemia, unspecified: Secondary | ICD-10-CM

## 2022-09-13 NOTE — Assessment & Plan Note (Signed)
No recent flares or exacerbations.  He quit smoking several years ago and has really been enjoying be able to play his trombone well.

## 2022-09-13 NOTE — Progress Notes (Signed)
Established Patient Office Visit  Subjective   Patient ID: Dennis Waller, male    DOB: 10-07-1950  Age: 72 y.o. MRN: [id number]  Chief Complaint  Patient presents with   Hypertension    HPI  Elevated blood pressure level-he says he just had a friend passed away this week and so it has been stressful he is up felt a little bit more anxious than usual.  His blood pressure was a little elevated in the 140s when I saw him last time.  But it is higher today.  He says he has done well and maintaining his weight loss to switch to a Mediterranean diet and feels like he is actually doing really well with that.  No recent headaches or chest pain or shortness of breath.   He wants [place] to take a look at his left ear he thinks there is wax buildup again.  He already has some high-frequency hearing loss in that ear but it has been a little worse.  Hyperlipidemia - tolerating stating well with no myalgias or significant side effects.  Lab Results  Component Value Date   CHOL 139 07/17/2021   HDL 41 07/17/2021   LDLCALC 72 07/17/2021   TRIG 179 (H) 07/17/2021   CHOLHDL 3.4 07/17/2021         ROS    Objective:     BP (!) 164/68   Pulse 69   Ht '5\' 9"'$  (1.753 m)   Wt 168 lb (76.2 kg)   SpO2 98%   BMI 24.81 kg/m    Physical Exam Constitutional:      Appearance: He is well-developed.  HENT:     Head: Normocephalic and atraumatic.     Right Ear: Tympanic membrane, ear canal and external ear normal.     Left Ear: Ear canal and external ear normal.     Ears:     Comments: Left canal is obstructed by was.      Nose: Nose normal.     Mouth/Throat:     Pharynx: Oropharynx is clear.  Eyes:     Conjunctiva/sclera: Conjunctivae normal.     Pupils: Pupils are equal, round, and reactive to light.  Neck:     Thyroid: No thyromegaly.  Cardiovascular:     Rate and Rhythm: Normal rate.     Heart sounds: Normal heart sounds.  Pulmonary:     Effort: Pulmonary effort is normal.      Breath sounds: Normal breath sounds.  Musculoskeletal:     Cervical back: Neck supple.  Lymphadenopathy:     Cervical: No cervical adenopathy.  Skin:    General: Skin is warm and dry.  Neurological:     Mental Status: He is alert and oriented to person, place, and time.      No results found for any visits on 09/13/22.    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  He quit smoking several years ago and has really been enjoying be able to play his trombone well.        Other   Hyperlipidemia    Continue statin.  Tolerating well.  Will get up-to-date labs.      Relevant Orders   Fe+TIBC+Fer   Lipid  Panel w/reflex Direct LDL   COMPLETE METABOLIC PANEL WITH GFR   CBC   Other Visit Diagnoses     Low ferritin       Relevant Orders   Fe+TIBC+Fer   COMPLETE METABOLIC PANEL WITH GFR   CBC   Low hemoglobin       Relevant Orders   CBC   Impacted cerumen of left ear       Other specified hearing loss of left ear, unspecified hearing status on contralateral side          Indication: Cerumen impaction of the ear(s) Medical necessity statement: On physical examination, cerumen impairs clinically significant portions of the external auditory canal, and tympanic membrane. Noted obstructive, copious cerumen that cannot be removed without magnification and instrumentations and lavage.  Consent: Discussed benefits and risks of procedure and verbal consent obtained Procedure: Patient was prepped for the procedure. Utilized an otoscope to assess and take note of the ear canal, the tympanic membrane, and the presence, amount, and placement of the cerumen. Gentle water irrigation and soft plastic curette was utilized to remove cerumen.  Post procedure examination:  shows cerumen was completely removed. Patient tolerated procedure well. The patient is made aware that they may experience temporary vertigo, temporary hearing loss, and temporary discomfort. If these symptom last for more than 24 hours to call the clinic or proceed to the ED.    Return in about 2 weeks (around 09/27/2022) for Nurse visit for BP check .  Marland Kitchen    Beatrice Lecher, MD

## 2022-09-13 NOTE — Assessment & Plan Note (Signed)
Continue statin.  Tolerating well.  Will get up-to-date labs.

## 2022-09-24 DIAGNOSIS — R790 Abnormal level of blood mineral: Secondary | ICD-10-CM | POA: Diagnosis not present

## 2022-09-24 DIAGNOSIS — D649 Anemia, unspecified: Secondary | ICD-10-CM | POA: Diagnosis not present

## 2022-09-24 DIAGNOSIS — E785 Hyperlipidemia, unspecified: Secondary | ICD-10-CM | POA: Diagnosis not present

## 2022-09-25 LAB — COMPLETE METABOLIC PANEL WITH GFR
AG Ratio: 1.9 (calc) (ref 1.0–2.5)
ALT: 9 U/L (ref 9–46)
AST: 13 U/L (ref 10–35)
Albumin: 4.5 g/dL (ref 3.6–5.1)
Alkaline phosphatase (APISO): 57 U/L (ref 35–144)
BUN/Creatinine Ratio: 11 (calc) (ref 6–22)
BUN: 18 mg/dL (ref 7–25)
CO2: 25 mmol/L (ref 20–32)
Calcium: 8.9 mg/dL (ref 8.6–10.3)
Chloride: 106 mmol/L (ref 98–110)
Creat: 1.58 mg/dL — ABNORMAL HIGH (ref 0.70–1.28)
Globulin: 2.4 g/dL (calc) (ref 1.9–3.7)
Glucose, Bld: 94 mg/dL (ref 65–99)
Potassium: 4.1 mmol/L (ref 3.5–5.3)
Sodium: 140 mmol/L (ref 135–146)
Total Bilirubin: 0.4 mg/dL (ref 0.2–1.2)
Total Protein: 6.9 g/dL (ref 6.1–8.1)
eGFR: 46 mL/min/{1.73_m2} — ABNORMAL LOW (ref >=60)

## 2022-09-25 LAB — CBC
HCT: 34 % — ABNORMAL LOW (ref 38.5–50.0)
Hemoglobin: 11.2 g/dL — ABNORMAL LOW (ref 13.2–17.1)
MCH: 32.3 pg (ref 27.0–33.0)
MCHC: 32.9 g/dL (ref 32.0–36.0)
MCV: 98 fL (ref 80.0–100.0)
MPV: 9.3 fL (ref 7.5–12.5)
Platelets: 232 10*3/uL (ref 140–400)
RBC: 3.47 10*6/uL — ABNORMAL LOW (ref 4.20–5.80)
RDW: 12.1 % (ref 11.0–15.0)
WBC: 8 10*3/uL (ref 3.8–10.8)

## 2022-09-25 LAB — LIPID PANEL W/REFLEX DIRECT LDL
Cholesterol: 147 mg/dL (ref <200)
HDL: 38 mg/dL — ABNORMAL LOW (ref >=40)
LDL Cholesterol (Calc): 78 mg/dL (calc)
Non-HDL Cholesterol (Calc): 109 mg/dL (calc) (ref <130)
Total CHOL/HDL Ratio: 3.9 (calc) (ref <5.0)
Triglycerides: 225 mg/dL — ABNORMAL HIGH (ref <150)

## 2022-09-25 LAB — IRON,TIBC AND FERRITIN PANEL
%SAT: 30 % (calc) (ref 20–48)
Ferritin: 33 ng/mL (ref 24–380)
Iron: 112 ug/dL (ref 50–180)
TIBC: 376 mcg/dL (calc) (ref 250–425)

## 2022-09-25 NOTE — Progress Notes (Signed)
Hi Dennis Waller, total cholesterol and LDL look good.  Triglycerides are still elevated.  Just continue to work on healthy food choices and regular exercise.  Kidney function is stable at 1.5.  Liver function looks great.  Dennis Waller is actually looking a little better.  It was 10.78 months ago it is up to 11.2.  Your iron levels are still continuing to improve.  Still continue the extra iron.  I would like to see your ferritin greater than 40.

## 2022-09-27 ENCOUNTER — Ambulatory Visit (INDEPENDENT_AMBULATORY_CARE_PROVIDER_SITE_OTHER): Payer: Medicare Other | Admitting: Family Medicine

## 2022-09-27 VITALS — BP 121/68 | HR 60 | Temp 98.2°F | Ht 69.0 in | Wt 167.0 lb

## 2022-09-27 DIAGNOSIS — Z0130 Encounter for examination of blood pressure without abnormal findings: Secondary | ICD-10-CM | POA: Insufficient documentation

## 2022-09-27 DIAGNOSIS — Z23 Encounter for immunization: Secondary | ICD-10-CM | POA: Diagnosis not present

## 2022-09-27 HISTORY — DX: Encounter for examination of blood pressure without abnormal findings: Z01.30

## 2022-09-27 NOTE — Progress Notes (Signed)
Bp looks awesome!!!    Orders Placed This Encounter  Procedures   Pfizer Fall 2023 Covid-19 Vaccine 27yr and older

## 2022-09-27 NOTE — Progress Notes (Signed)
   Subjective:    Patient ID: Dennis Waller, male    DOB: October 27, 1950, 72 y.o.   MRN: [id number]  HPI Pt here for COVID vaccine and blood pressure check, pt states he had an elevated blood pressure reading at last visit.     Review of Systems     Objective:   Physical Exam        Assessment & Plan:  Pts tolerated injection well in Left deltoid without any complications. Pts blood pressure today was 121/68, pt advised to continue current plan without any changes and follow up with pcp at next scheduled visit.

## 2022-11-14 ENCOUNTER — Ambulatory Visit (INDEPENDENT_AMBULATORY_CARE_PROVIDER_SITE_OTHER): Payer: Medicare Other | Admitting: Family Medicine

## 2022-11-14 DIAGNOSIS — Z0000 Encounter for general adult medical examination without abnormal findings: Secondary | ICD-10-CM

## 2022-11-14 NOTE — Patient Instructions (Signed)
[place] Maintenance Summary and Written Plan of Care  Mr. Schoepp ,  Thank you for allowing me to perform your Medicare Annual Wellness Visit and for your ongoing commitment to your health.   Health Maintenance & Immunization History Health Maintenance  Topic Date Due   COVID-19 Vaccine (6 - 2023-24 season) 11/22/2022   INFLUENZA VACCINE  03/14/2023   Lung Cancer Screening  05/30/2023   Medicare Annual Wellness (AWV)  11/14/2023   COLONOSCOPY (Pts 45-37yrs Insurance coverage will need to be confirmed)  11/03/2026   DTaP/Tdap/Td (3 - Td or Tdap) 01/02/2032   Pneumonia Vaccine 67+ Years old  Completed   Hepatitis C Screening  Completed   Zoster Vaccines- Shingrix  Completed   HPV VACCINES  Aged Out   Immunization History  Administered Date(s) Administered   COVID-19, mRNA, vaccine(Comirnaty)12 years and older 09/27/2022   Fluad Quad(high Dose 65+) 07/01/2019, 06/10/2020, 07/05/2021, 07/02/2022   Influenza, High Dose Seasonal PF 06/12/2017   Influenza,inj,Quad PF,6+ Mos 07/02/2013, 06/11/2014, 10/17/2015, 04/17/2016, 05/28/2018   PFIZER(Purple Top)SARS-COV-2 Vaccination 10/02/2019, 10/27/2019, 06/07/2020   Pfizer Covid-19 Vaccine Bivalent Booster 57yrs & up 06/23/2021   Pneumococcal Conjugate-13 06/11/2014   Pneumococcal Polysaccharide-23 08/13/2004, 10/17/2015   Tdap 03/09/2011, 01/01/2022   Zoster Recombinat (Shingrix) 07/15/2019, 09/16/2019   Zoster, Live 03/09/2011    These are the patient goals that we discussed:  Goals Addressed   None      This is a list of Health Maintenance Items that are overdue or due now: There are no preventive care reminders to display for this patient.    Orders/Referrals Placed Today: No orders of the defined types were placed in this encounter.  (Contact our referral department at [phone] if you have not spoken with someone about your referral appointment within the next 5 days)    Follow-up  Plan Follow-up with Hali Marry, MD as planned Medicare wellness visit in one year.  Patient will access AVS on my chart.      Health Maintenance, Male Adopting a healthy lifestyle and getting preventive care are important in promoting health and wellness. Ask your health care provider about: The right schedule for you to have regular tests and exams. Things you can do on your own to prevent diseases and keep yourself healthy. What should I know about diet, weight, and exercise? Eat a healthy diet  Eat a diet that includes plenty of vegetables, fruits, low-fat dairy products, and lean protein. Do not eat a lot of foods that are high in solid fats, added sugars, or sodium. Maintain a healthy weight Body mass index (BMI) is a measurement that can be used to identify possible weight problems. It estimates body fat based on height and weight. Your health care provider can help determine your BMI and help you achieve or maintain a healthy weight. Get regular exercise Get regular exercise. This is one of the most important things you can do for your health. Most adults should: Exercise for at least 150 minutes each week. The exercise should increase your heart rate and make you sweat (moderate-intensity exercise). Do strengthening exercises at least twice a week. This is in addition to the moderate-intensity exercise. Spend less time sitting. Even light physical activity can be beneficial. Watch cholesterol and blood lipids Have your blood tested for lipids and cholesterol at 74 years of age, then have this test every 5 years. You may need to have your cholesterol levels checked more often if: Your lipid or cholesterol levels  are high. You are older than 74 years of age. You are at high risk for heart disease. What should I know about cancer screening? Many types of cancers can be detected early and may often be prevented. Depending on your health history and family history, you  may need to have cancer screening at various ages. This may include screening for: Colorectal cancer. Prostate cancer. Skin cancer. Lung cancer. What should I know about heart disease, diabetes, and high blood pressure? Blood pressure and heart disease High blood pressure causes heart disease and increases the risk of stroke. This is more likely to develop in people who have high blood pressure readings or are overweight. Talk with your health care provider about your target blood pressure readings. Have your blood pressure checked: Every 3-5 years if you are 59-65 years of age. Every year if you are 69 years old or older. If you are between the ages of 38 and 6 and are a current or former smoker, ask your health care provider if you should have a one-time screening for abdominal aortic aneurysm (AAA). Diabetes Have regular diabetes screenings. This checks your fasting blood sugar level. Have the screening done: Once every three years after age 65 if you are at a normal weight and have a low risk for diabetes. More often and at a younger age if you are overweight or have a high risk for diabetes. What should I know about preventing infection? Hepatitis B If you have a higher risk for hepatitis B, you should be screened for this virus. Talk with your health care provider to find out if you are at risk for hepatitis B infection. Hepatitis C Blood testing is recommended for: Everyone born from [birth year] through 1967. Anyone with known risk factors for hepatitis C. Sexually transmitted infections (STIs) You should be screened each year for STIs, including gonorrhea and chlamydia, if: You are sexually active and are younger than 74 years of age. You are older than 74 years of age and your health care provider tells you that you are at risk for this type of infection. Your sexual activity has changed since you were last screened, and you are at increased risk for chlamydia or gonorrhea. Ask your  health care provider if you are at risk. Ask your health care provider about whether you are at high risk for HIV. Your health care provider may recommend a prescription medicine to help prevent HIV infection. If you choose to take medicine to prevent HIV, you should first get tested for HIV. You should then be tested every 3 months for as long as you are taking the medicine. Follow these instructions at home: Alcohol use Do not drink alcohol if your health care provider tells you not to drink. If you drink alcohol: Limit how much you have to 0-2 drinks a day. Know how much alcohol is in your drink. In the [place] one drink equals one 12 oz bottle of beer (355 mL), one 5 oz glass of wine (148 mL), or one 1 oz glass of hard liquor (44 mL). Lifestyle Do not use any products that contain nicotine or tobacco. These products include cigarettes, chewing tobacco, and vaping devices, such as e-cigarettes. If you need help quitting, ask your health care provider. Do not use street drugs. Do not share needles. Ask your health care provider for help if you need support or information about quitting drugs. General instructions Schedule regular health, dental, and eye exams. Stay current with your vaccines. Tell  your health care provider if: You often feel depressed. You have ever been abused or do not feel safe at home. Summary Adopting a healthy lifestyle and getting preventive care are important in promoting health and wellness. Follow your health care provider's instructions about healthy diet, exercising, and getting tested or screened for diseases. Follow your health care provider's instructions on monitoring your cholesterol and blood pressure. This information is not intended to replace advice given to you by your health care provider. Make sure you discuss any questions you have with your health care provider. Document Revised: 12/19/2020 Document Reviewed: 12/19/2020 Elsevier Patient Education   [place].

## 2022-11-14 NOTE — Progress Notes (Signed)
MEDICARE ANNUAL WELLNESS VISIT  11/14/2022  Telephone Visit Disclaimer This Medicare AWV was conducted by telephone due to national recommendations for restrictions regarding the COVID-19 Pandemic (e.g. social distancing).  I verified, using two identifiers, that I am speaking with Dennis Waller or their authorized healthcare agent. I discussed the limitations, risks, security, and privacy concerns of performing an evaluation and management service by telephone and the potential availability of an in-person appointment in the future. The patient expressed understanding and agreed to proceed.  Location of Patient: Home Location of Provider (nurse):  Provider home  Subjective:    Dennis Waller is a 72 y.o. male patient of Metheney, Rene Kocher, MD who had a Medicare Annual Wellness Visit today via telephone. Dennis Waller is Retired and lives with their spouse. he has 3 children. he reports that he is socially active and does interact with friends/family regularly. he is moderately physically active and enjoys music, reading and fishing.  Patient Care Team: Hali Marry, MD as PCP - General     11/14/2022    9:29 AM 11/10/2021    9:50 AM 11/04/2020    9:03 AM 05/28/2018   11:04 AM 06/11/2014    1:16 PM  Advanced Directives  Does Patient Have a Medical Advance Directive? Yes Yes No Yes No  Type of Advance Directive Living will Living will  [name];Out of facility DNR (pink MOST or yellow form);Living will   Does patient want to make changes to medical advance directive? No - Patient declined No - Patient declined  No - Patient declined   Copy of [place] in Chart?    No - copy requested   Would patient like information on creating a medical advance directive?   No - Patient declined  No - patient declined information    Hospital Utilization Over the Past 12 Months: # of hospitalizations or ER visits: 0 # of surgeries: 0  Review of  Systems    Patient reports that his overall health is unchanged compared to last year.  History obtained from chart review and the patient  Patient Reported Readings (BP, Pulse, CBG, Weight, etc) none  Pain Assessment Pain : No/denies pain     Current Medications & Allergies (verified) Allergies as of 11/14/2022   No Known Allergies      Medication List        Accurate as of November 14, 2022  9:37 AM. If you have any questions, ask your nurse or doctor.          acetaminophen 500 MG tablet Commonly known as: TYLENOL Take 500 mg by mouth every 6 (six) hours as needed.   AMBULATORY NON FORMULARY MEDICATION Take 1 each by mouth daily. Medication Name: CBD gummy for sleep and arthritis   atorvastatin 40 MG tablet Commonly known as: LIPITOR TAKE 1 TABLET BY MOUTH  DAILY   FERROUS SULFATE IRON PO Take by mouth.   ibuprofen 200 MG tablet Commonly known as: ADVIL Take 200 mg by mouth every 6 (six) hours as needed.   VITAMIN B 12 PO Take by mouth.        History (reviewed): Past Medical History:  Diagnosis Date   Anemia check with Dr. Suzi Roots   Arthritis years ago   Blood pressure check 09/27/2022   Cataract    Chronic kidney disease    kidney stones   GERD (gastroesophageal reflux disease) It returned while I was in the hospital  Zantac  has relieved the symptons   Hyperlipidemia    Neuromuscular disorder years   neuropathy in legs and feet   Past Surgical History:  Procedure Laterality Date   BACK SURGERY  08/14/1995   ? lumbar disc fusion   FRACTURE SURGERY  18yrs ago   broken nose   KIDNEY STONE SURGERY     SPINE SURGERY  40 years ago had a bulging disc problem taken care of   Family History  Problem Relation Age of Onset   Hyperlipidemia Father    Hypertension Father    Arthritis Father    Diabetes Mother    Hypertension Mother    Cancer Mother        colon   COPD Mother    Arthritis Brother    Diabetes Sister    Social History    Socioeconomic History   Marital status: Married    Spouse name: diane   Number of children: 3   Years of education: 12   Highest education level: 12th grade  Occupational History   Occupation: retired    Comment: Public house manager - [employer]  Tobacco Use   Smoking status: Former    Packs/day: 2.00    Years: 55.00    Additional pack years: 0.00    Total pack years: 110.00    Types: Cigarettes    Quit date: 11/09/2015    Years since quitting: 7.0   Smokeless tobacco: Never  Vaping Use   Vaping Use: Never used  Substance and Sexual Activity   Alcohol use: Not Currently    Comment: very seldom   Drug use: No   Sexual activity: Not Currently  Other Topics Concern   Not on file  Social History Narrative   Lives with his wife. In the band at church. Wants to get back in exercise routine but due to his arthritis he is not able to do stand for long periods of time. Enjoys playing his trombone. He enjoys reading and fishing.   Social Determinants of Health   Financial Resource Strain: Low Risk  (11/10/2022)   Overall Financial Resource Strain (CARDIA)    Difficulty of Paying Living Expenses: Not hard at all  Food Insecurity: No Food Insecurity (11/10/2022)   Hunger Vital Sign    Worried About Running Out of Food in the Last Year: Never true    Ran Out of Food in the Last Year: Never true  Transportation Needs: No Transportation Needs (11/10/2022)   PRAPARE - Hydrologist (Medical): No    Lack of Transportation (Non-Medical): No  Physical Activity: Inactive (11/10/2022)   Exercise Vital Sign    Days of Exercise per Week: 0 days    Minutes of Exercise per Session: 0 min  Stress: No Stress Concern Present (11/10/2022)   [place]    Feeling of Stress : Not at all  Social Connections: [place] (11/14/2022)   Social Connection and Isolation Panel [NHANES]    Frequency of  Communication with Friends and Family: Three times a week    Frequency of Social Gatherings with Friends and Family: Twice a week    Attends Religious Services: More than 4 times per year    Active Member of [employer] or Organizations: Yes    Attends Archivist Meetings: More than 4 times per year    Marital Status: Married    Activities of Daily Living    11/10/2022    9:40  AM  In your present state of health, do you have any difficulty performing the following activities:  Hearing? 0  Vision? 0  Difficulty concentrating or making decisions? 0  Walking or climbing stairs? 0  Dressing or bathing? 0  Doing errands, shopping? 0  Preparing Food and eating ? N  Using the Toilet? N  In the past six months, have you accidently leaked urine? N  Do you have problems with loss of bowel control? N  Managing your Medications? N  Managing your Finances? N  Housekeeping or managing your Housekeeping? N    Patient Education/ Literacy How often do you need to have someone help you when you read instructions, pamphlets, or other written materials from your doctor or pharmacy?: 1 - Never What is the last grade level you completed in school?: 12th grade  Exercise Current Exercise Habits: The patient does not participate in regular exercise at present, Exercise limited by: None identified  Diet Patient reports consuming 1 meals a day and 2 snack(s) a day Patient reports that his primary diet is: Regular Patient reports that she does have regular access to food.   Depression Screen    11/14/2022    9:32 AM 09/13/2022    3:11 PM 01/16/2022    8:13 AM 11/10/2021    9:36 AM 09/18/2021    7:34 AM 11/04/2020    9:06 AM 07/01/2019   10:00 AM  PHQ 2/9 Scores  PHQ - 2 Score 0 1 1 0 1 0 0     Fall Risk    11/14/2022    9:31 AM 11/10/2022    9:40 AM 09/13/2022    3:11 PM 01/16/2022    8:12 AM 11/06/2021   11:57 AM  Fall Risk   Falls in the past year? 1 0 0 1 1  Number falls in past yr: 0 0 0 0 0   Injury with Fall? 0 0 0 0 0  Risk for fall due to : History of fall(s)  No Fall Risks No Fall Risks No Fall Risks  Follow up Falls evaluation completed;Education provided;Falls prevention discussed  Falls evaluation completed Falls prevention discussed Falls evaluation completed     Objective:  Dennis Waller seemed alert and oriented and he participated appropriately during our telephone visit.  Blood Pressure Weight BMI  BP Readings from Last 3 Encounters:  09/27/22 121/68  09/13/22 (!) 164/68  01/16/22 136/81   Wt Readings from Last 3 Encounters:  09/27/22 167 lb (75.8 kg)  09/13/22 168 lb (76.2 kg)  01/16/22 162 lb (73.5 kg)   BMI Readings from Last 1 Encounters:  09/27/22 24.66 kg/m    *Unable to obtain current vital signs, weight, and BMI due to telephone visit type  Hearing/Vision  Akeen did not seem to have difficulty with hearing/understanding during the telephone conversation Reports that he has had a formal eye exam by an eye care professional within the past year Reports that he has not had a formal hearing evaluation within the past year *Unable to fully assess hearing and vision during telephone visit type  Cognitive Function:    11/14/2022    9:32 AM 11/10/2021    9:44 AM 11/04/2020    9:17 AM 05/28/2018   11:12 AM  6CIT Screen  What Year? 0 points 0 points 0 points 0 points  What month? 0 points 0 points 0 points 0 points  What time? 0 points 0 points 0 points 0 points  Count back from 20 0  points 0 points 0 points 0 points  Months in reverse 0 points 0 points 0 points 0 points  Repeat phrase 0 points 0 points 0 points 0 points  Total Score 0 points 0 points 0 points 0 points   (Normal:0-7, Significant for Dysfunction: >8)  Normal Cognitive Function Screening: Yes   Immunization & Health Maintenance Record Immunization History  Administered Date(s) Administered   COVID-19, mRNA, vaccine(Comirnaty)12 years and older 09/27/2022   Fluad Quad(high  Dose 65+) 07/01/2019, 06/10/2020, 07/05/2021, 07/02/2022   Influenza, High Dose Seasonal PF 06/12/2017   Influenza,inj,Quad PF,6+ Mos 07/02/2013, 06/11/2014, 10/17/2015, 04/17/2016, 05/28/2018   PFIZER(Purple Top)SARS-COV-2 Vaccination 10/02/2019, 10/27/2019, 06/07/2020   Pfizer Covid-19 Vaccine Bivalent Booster 82yrs & up 06/23/2021   Pneumococcal Conjugate-13 06/11/2014   Pneumococcal Polysaccharide-23 08/13/2004, 10/17/2015   Tdap 03/09/2011, 01/01/2022   Zoster Recombinat (Shingrix) 07/15/2019, 09/16/2019   Zoster, Live 03/09/2011    Health Maintenance  Topic Date Due   COVID-19 Vaccine (6 - 2023-24 season) 11/22/2022   INFLUENZA VACCINE  03/14/2023   Lung Cancer Screening  05/30/2023   Medicare Annual Wellness (AWV)  11/14/2023   COLONOSCOPY (Pts 45-5yrs Insurance coverage will need to be confirmed)  11/03/2026   DTaP/Tdap/Td (3 - Td or Tdap) 01/02/2032   Pneumonia Vaccine 33+ Years old  Completed   Hepatitis C Screening  Completed   Zoster Vaccines- Shingrix  Completed   HPV VACCINES  Aged Out       Assessment  This is a routine wellness examination for [employer].  Health Maintenance: Due or Overdue There are no preventive care reminders to display for this patient.   Dennis Waller does not need a referral for Community Assistance: Care Management:   no Social Work:    no Prescription Assistance:  no Nutrition/Diabetes Education:  no   Plan:  Personalized Goals  Goals Addressed               This Visit's Progress     Patient Stated (pt-stated)        Patient stated that he would like to continue to be ambulatory.        Personalized Health Maintenance & Screening Recommendations  There are no preventive care reminders to display for this patient.  Lung Cancer Screening Recommended: yes; patient is scheduled for one in October. (Low Dose CT Chest recommended if Age 49-80 years, 20 pack-year currently smoking OR have quit w/in past 15  years) Hepatitis C Screening recommended: no HIV Screening recommended: no  Advanced Directives: Written information was not prepared per patient's request.  Referrals & Orders No orders of the defined types were placed in this encounter.   Follow-up Plan Follow-up with Hali Marry, MD as planned Medicare wellness visit in one year.  Patient will access AVS on my chart.   I have personally reviewed and noted the following in the patient's chart:   Medical and social history Use of alcohol, tobacco or illicit drugs  Current medications and supplements Functional ability and status Nutritional status Physical activity Advanced directives List of other physicians Hospitalizations, surgeries, and ER visits in previous 12 months Vitals Screenings to include cognitive, depression, and falls Referrals and appointments  In addition, I have reviewed and discussed with Dennis Waller certain preventive protocols, quality metrics, and best practice recommendations. A written personalized care plan for preventive services as well as general preventive health recommendations is available and can be mailed to the patient at his request.  Dennis Gens, RN BSN  11/14/2022

## 2022-11-25 ENCOUNTER — Other Ambulatory Visit: Payer: Self-pay | Admitting: Family Medicine

## 2022-11-25 DIAGNOSIS — E785 Hyperlipidemia, unspecified: Secondary | ICD-10-CM

## 2022-12-03 DIAGNOSIS — H25813 Combined forms of age-related cataract, bilateral: Secondary | ICD-10-CM | POA: Diagnosis not present

## 2022-12-03 DIAGNOSIS — H353111 Nonexudative age-related macular degeneration, right eye, early dry stage: Secondary | ICD-10-CM | POA: Diagnosis not present

## 2023-01-03 DIAGNOSIS — E785 Hyperlipidemia, unspecified: Secondary | ICD-10-CM | POA: Diagnosis not present

## 2023-01-03 DIAGNOSIS — Z79899 Other long term (current) drug therapy: Secondary | ICD-10-CM | POA: Diagnosis not present

## 2023-01-03 DIAGNOSIS — Z87891 Personal history of nicotine dependence: Secondary | ICD-10-CM | POA: Diagnosis not present

## 2023-01-03 DIAGNOSIS — M1990 Unspecified osteoarthritis, unspecified site: Secondary | ICD-10-CM | POA: Diagnosis not present

## 2023-01-03 DIAGNOSIS — H25811 Combined forms of age-related cataract, right eye: Secondary | ICD-10-CM | POA: Diagnosis not present

## 2023-01-04 DIAGNOSIS — H25812 Combined forms of age-related cataract, left eye: Secondary | ICD-10-CM | POA: Diagnosis not present

## 2023-01-24 DIAGNOSIS — Z961 Presence of intraocular lens: Secondary | ICD-10-CM | POA: Diagnosis not present

## 2023-01-24 DIAGNOSIS — E785 Hyperlipidemia, unspecified: Secondary | ICD-10-CM | POA: Diagnosis not present

## 2023-01-24 DIAGNOSIS — H25812 Combined forms of age-related cataract, left eye: Secondary | ICD-10-CM | POA: Diagnosis not present

## 2023-01-24 DIAGNOSIS — Z87891 Personal history of nicotine dependence: Secondary | ICD-10-CM | POA: Diagnosis not present

## 2023-01-24 DIAGNOSIS — J449 Chronic obstructive pulmonary disease, unspecified: Secondary | ICD-10-CM | POA: Diagnosis not present

## 2023-01-24 DIAGNOSIS — K219 Gastro-esophageal reflux disease without esophagitis: Secondary | ICD-10-CM | POA: Diagnosis not present

## 2023-01-24 DIAGNOSIS — Z79899 Other long term (current) drug therapy: Secondary | ICD-10-CM | POA: Diagnosis not present

## 2023-01-24 DIAGNOSIS — M1990 Unspecified osteoarthritis, unspecified site: Secondary | ICD-10-CM | POA: Diagnosis not present

## 2023-01-24 DIAGNOSIS — Z9841 Cataract extraction status, right eye: Secondary | ICD-10-CM | POA: Diagnosis not present

## 2023-01-24 DIAGNOSIS — J439 Emphysema, unspecified: Secondary | ICD-10-CM | POA: Diagnosis not present

## 2023-01-24 HISTORY — PX: OTHER SURGICAL HISTORY: SHX169

## 2023-02-19 ENCOUNTER — Other Ambulatory Visit: Payer: Self-pay | Admitting: Family Medicine

## 2023-02-19 DIAGNOSIS — E785 Hyperlipidemia, unspecified: Secondary | ICD-10-CM

## 2023-05-31 ENCOUNTER — Ambulatory Visit: Payer: Medicare Other

## 2023-05-31 DIAGNOSIS — F17210 Nicotine dependence, cigarettes, uncomplicated: Secondary | ICD-10-CM | POA: Diagnosis not present

## 2023-05-31 DIAGNOSIS — I2510 Atherosclerotic heart disease of native coronary artery without angina pectoris: Secondary | ICD-10-CM

## 2023-05-31 DIAGNOSIS — Z87891 Personal history of nicotine dependence: Secondary | ICD-10-CM

## 2023-05-31 DIAGNOSIS — I700 Atherosclerosis of aorta: Secondary | ICD-10-CM | POA: Diagnosis not present

## 2023-05-31 DIAGNOSIS — J439 Emphysema, unspecified: Secondary | ICD-10-CM

## 2023-05-31 DIAGNOSIS — Z122 Encounter for screening for malignant neoplasm of respiratory organs: Secondary | ICD-10-CM | POA: Diagnosis not present

## 2023-06-26 ENCOUNTER — Other Ambulatory Visit: Payer: Self-pay

## 2023-06-26 DIAGNOSIS — Z122 Encounter for screening for malignant neoplasm of respiratory organs: Secondary | ICD-10-CM

## 2023-06-26 DIAGNOSIS — Z87891 Personal history of nicotine dependence: Secondary | ICD-10-CM

## 2023-07-02 ENCOUNTER — Ambulatory Visit (INDEPENDENT_AMBULATORY_CARE_PROVIDER_SITE_OTHER): Payer: Medicare Other

## 2023-07-02 DIAGNOSIS — Z23 Encounter for immunization: Secondary | ICD-10-CM

## 2023-07-10 IMAGING — CT CT CHEST LUNG CANCER SCREENING LOW DOSE W/O CM
2 of 4 series · 15 of 36 positions shown, 18 images · non-contrast
Comparison: 05/18/2020

CLINICAL DATA: Lung cancer screening. Fifty-five pack-year history.
Former asymptomatic smoker.

EXAM:
CT CHEST WITHOUT CONTRAST LOW-DOSE FOR LUNG CANCER SCREENING
TECHNIQUE: Multidetector CT imaging of the chest was performed following the
standard protocol without IV contrast.

[Series 3: lungs · axial · 0.70mm/px · z∈[-309,-24]mm · 12 of 315 slices shown, 15 images]
[im 15/315  mediastinal]
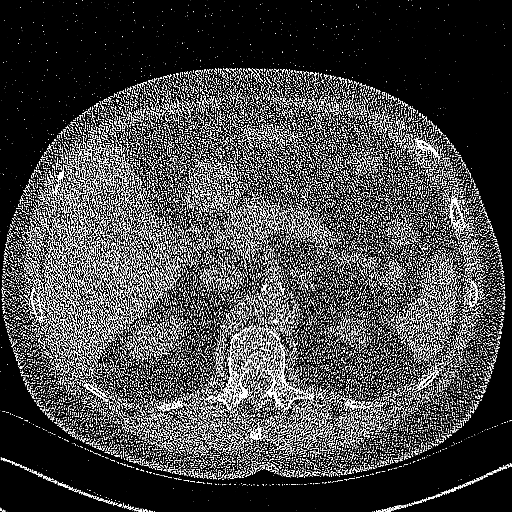
[im 15/315  lung]
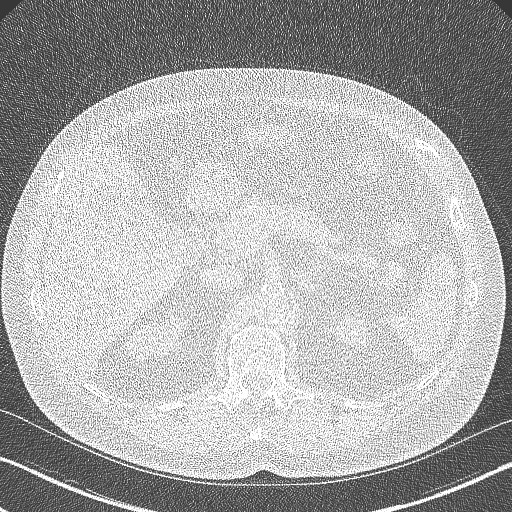
[im 43/315  lung]
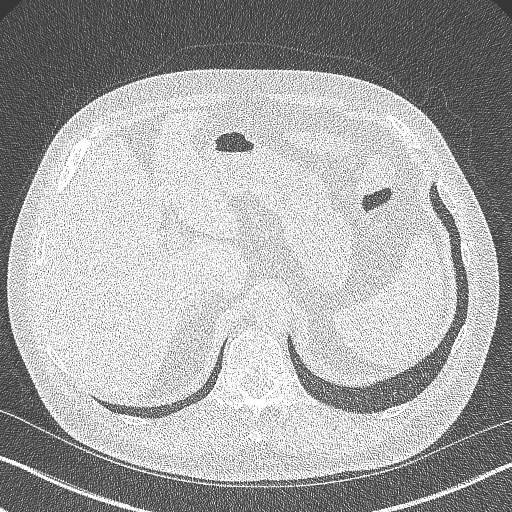
[im 72/315  lung]
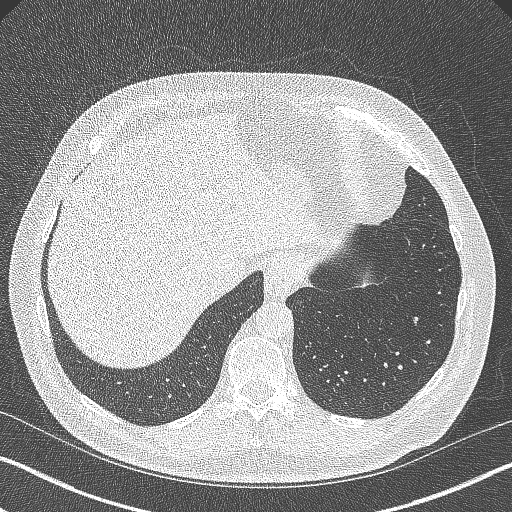
[im 100/315  lung]
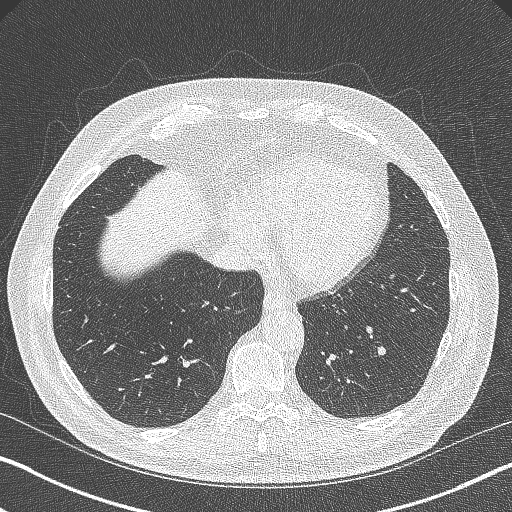
[im 115/315  mediastinal]
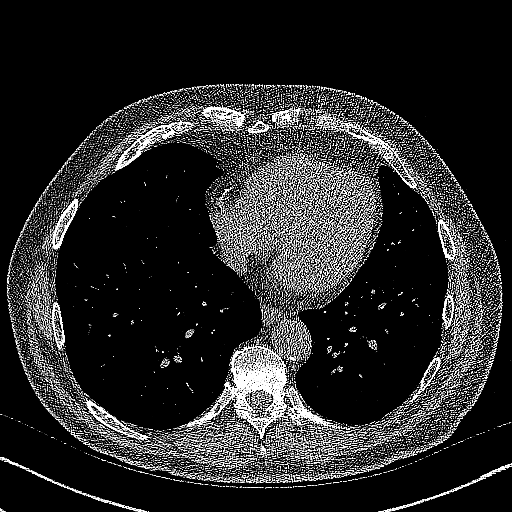
[im 115/315  lung]
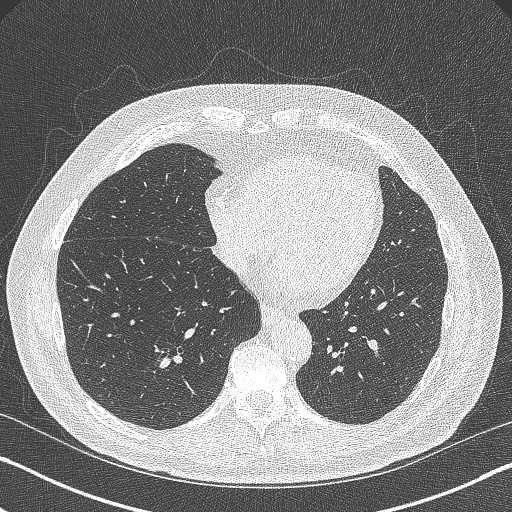
[im 143/315  lung]
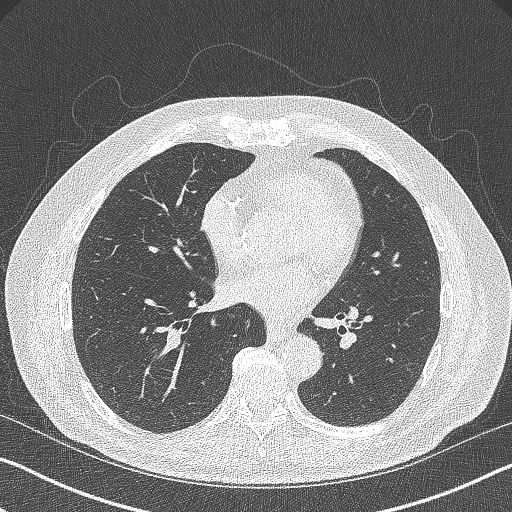
[im 172/315  lung]
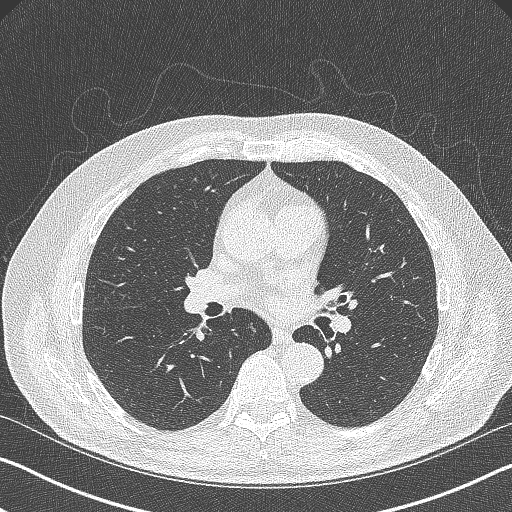
[im 200/315  lung]
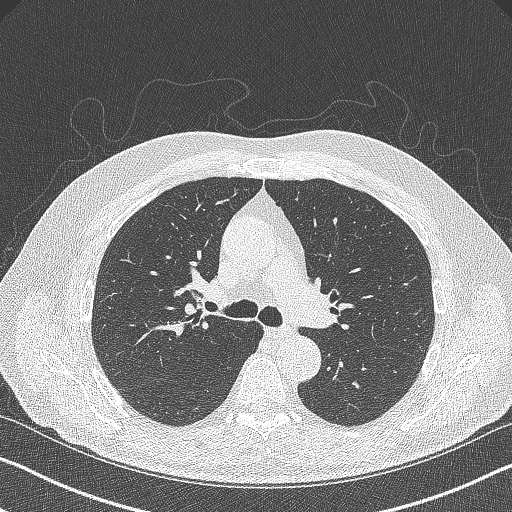
[im 215/315  mediastinal]
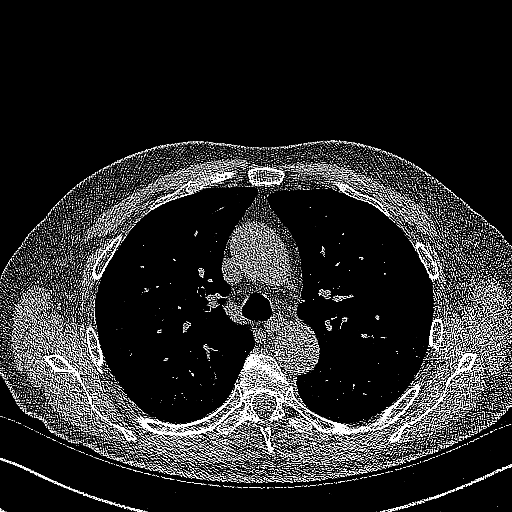
[im 215/315  lung]
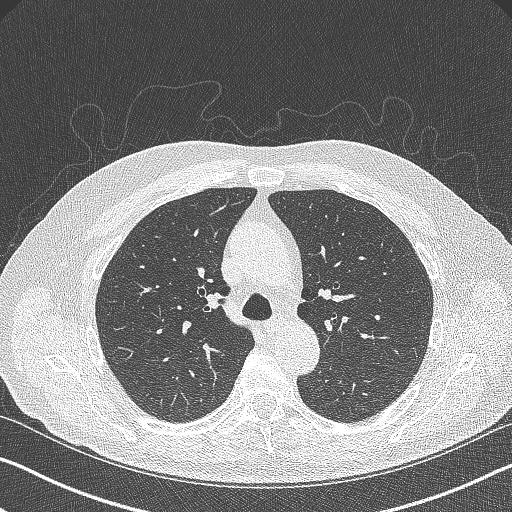
[im 243/315  lung]
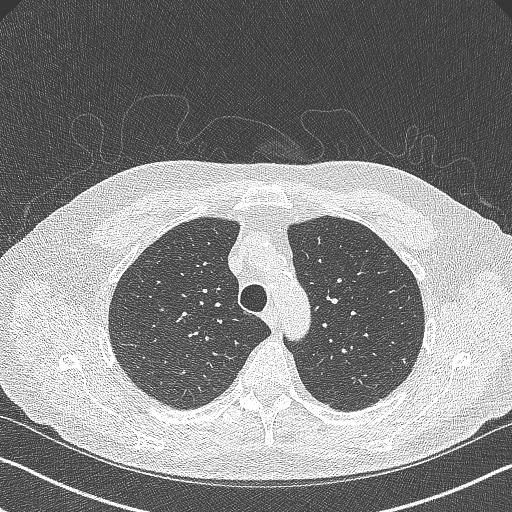
[im 272/315  lung]
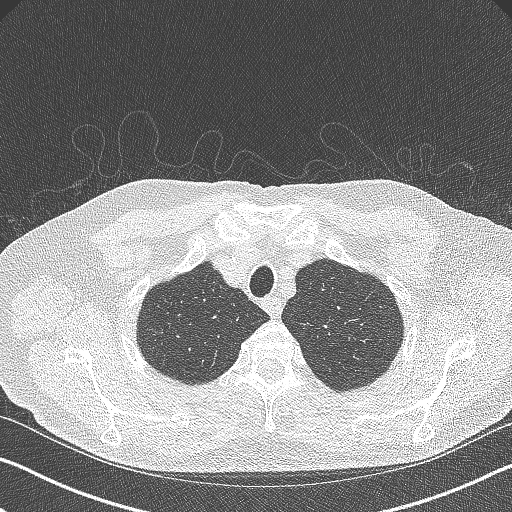
[im 300/315  lung]
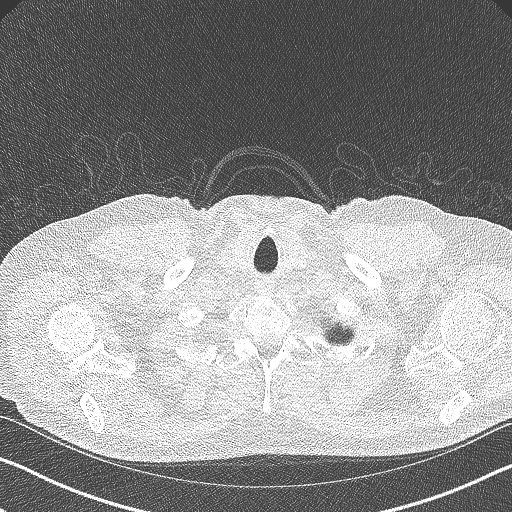

[Series 4: coronal · coronal · 0.64mm/px · 3 of 257 slices shown]
[im 52/257  lung]
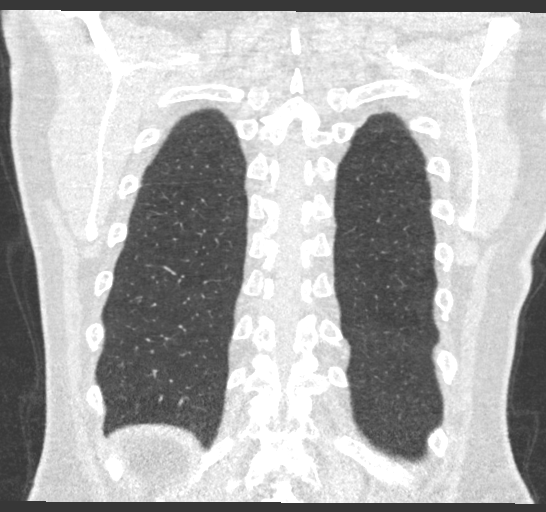
[im 103/257  lung]
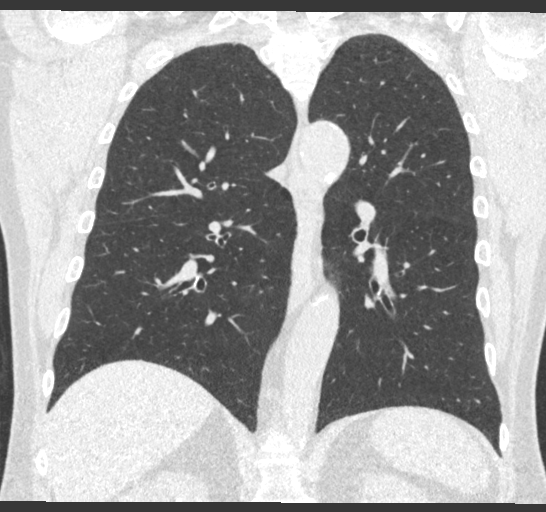
[im 154/257  lung]
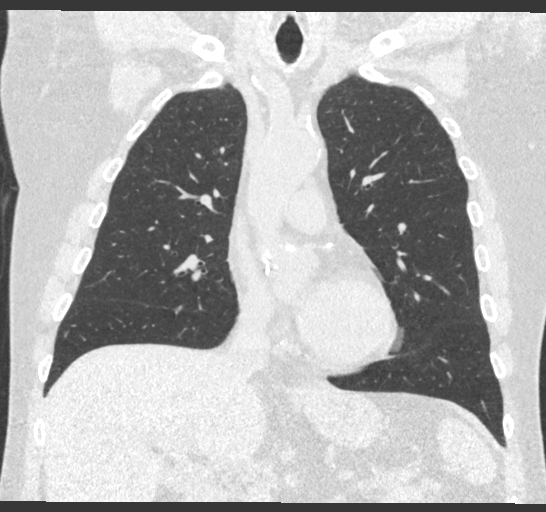

[15 of 36 positions shown; findings below may reference images not displayed]

FINDINGS: Cardiovascular: Aortic atherosclerosis and coronary artery
calcifications. Normal heart size. No pericardial effusion.

Mediastinum/Nodes: No enlarged mediastinal, hilar, or axillary lymph
nodes. Thyroid gland, trachea, and esophagus demonstrate no
significant findings.

Lungs/Pleura: No pleural effusion or pneumothorax. Mild
centrilobular and paraseptal emphysema. Tiny nodule in the left
upper lobe has a mean derived diameter of 2.9 mm and is unchanged
from previous exam. No new or suspicious lung nodules.

Upper Abdomen: No acute abnormality. Aortic atherosclerosis.
Gallstone noted.

Musculoskeletal: No chest wall mass or suspicious bone lesions
identified.
IMPRESSION: 1. Lung-RADS 2, benign appearance or behavior. Continue annual
screening with low-dose chest CT without contrast in 12 months.
2. Gallstone.
3. Aortic Atherosclerosis ([id number].J) and Emphysema ([id number].2).

## 2023-09-15 ENCOUNTER — Other Ambulatory Visit: Payer: Self-pay | Admitting: Family Medicine

## 2023-09-15 DIAGNOSIS — E785 Hyperlipidemia, unspecified: Secondary | ICD-10-CM

## 2023-11-19 ENCOUNTER — Ambulatory Visit (INDEPENDENT_AMBULATORY_CARE_PROVIDER_SITE_OTHER): Payer: Medicare Other

## 2023-11-19 VITALS — Ht 69.0 in | Wt 170.0 lb

## 2023-11-19 DIAGNOSIS — Z0000 Encounter for general adult medical examination without abnormal findings: Secondary | ICD-10-CM | POA: Diagnosis not present

## 2023-11-19 NOTE — Progress Notes (Signed)
 Subjective:   Dennis Waller is a 73 y.o. male who presents for Medicare Annual/Subsequent preventive examination.  Visit Complete: Virtual I connected with  Dennis Waller on 11/19/23 by a audio enabled telemedicine application and verified that I am speaking with the correct person using two identifiers.  Patient Location: Home  Provider Location: Office/Clinic  I discussed the limitations of evaluation and management by telemedicine. The patient expressed understanding and agreed to proceed.  Vital Signs: Because this visit was a virtual/telehealth visit, some criteria may be missing or patient reported. Any vitals not documented were not able to be obtained and vitals that have been documented are patient reported.  Patient Medicare AWV questionnaire was completed by the patient on 11/10/23; I have confirmed that all information answered by patient is correct and no changes since this date.  Cardiac Risk Factors include: advanced age (>66men, >48 women);male gender;smoking/ tobacco exposure;dyslipidemia     Objective:    Today's Vitals   11/19/23 0952  Weight: 170 lb (77.1 kg)  Height: 5\' 9"  (1.753 m)   Body mass index is 25.1 kg/m.     11/19/2023    9:58 AM 11/14/2022    9:29 AM 11/10/2021    9:50 AM 11/04/2020    9:03 AM 05/28/2018   11:04 AM 06/11/2014    1:16 PM  Advanced Directives  Does Patient Have a Medical Advance Directive? Yes Yes Yes No Yes No  Type of Advance Directive Living will Living will Living will  Healthcare Power of [place];Out of facility DNR (pink MOST or yellow form);Living will   Does patient want to make changes to medical advance directive? No - Patient declined No - Patient declined No - Patient declined  No - Patient declined   Copy of Healthcare Power of Attorney in Chart?     No - copy requested   Would patient like information on creating a medical advance directive?    No - Patient declined  No - patient declined information     Current Medications (verified) Outpatient Encounter Medications as of 11/19/2023  Medication Sig   acetaminophen (TYLENOL) 500 MG tablet Take 500 mg by mouth every 6 (six) hours as needed.   AMBULATORY NON FORMULARY MEDICATION Take 1 each by mouth daily. Medication Name: CBD gummy for sleep and arthritis   atorvastatin (LIPITOR) 40 MG tablet TAKE 1 TABLET BY MOUTH DAILY   Cyanocobalamin (VITAMIN B 12 PO) Take by mouth.   Ferrous Sulfate Dried (FERROUS SULFATE IRON PO) Take by mouth.   ibuprofen (ADVIL) 200 MG tablet Take 200 mg by mouth every 6 (six) hours as needed.   No facility-administered encounter medications on file as of 11/19/2023.    Allergies (verified) Patient has no known allergies.   History: Past Medical History:  Diagnosis Date   Anemia check with Dr. 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 lumbar disc fusion   FRACTURE SURGERY  67yrs ago   broken nose   KIDNEY STONE SURGERY     SPINE SURGERY  40 years ago had a bulging  disc problem taken care of   Family History  Problem Relation Age of Onset   Hyperlipidemia Father    Hypertension Father    Arthritis Father    Diabetes Mother    Hypertension Mother    Cancer Mother        colon   COPD Mother    Arthritis Brother    Diabetes Sister    Social History   Socioeconomic History   Marital status: Married    Spouse name: diane   Number of children: 3   Years of education: 12   Highest education level: 12th grade  Occupational History   Occupation: retired    Comment: Web designer - airplanes  Tobacco Use   Smoking status: Former     Current packs/day: 0.00    Average packs/day: 2.0 packs/day for 55.0 years (110.0 ttl pk-yrs)    Types: Cigarettes    Start date: 11/08/1960    Quit date: 11/09/2015    Years since quitting: 8.0   Smokeless tobacco: Never  Vaping Use   Vaping status: Never Used  Substance and Sexual Activity   Alcohol use: Not Currently    Comment: very seldom   Drug use: No   Sexual activity: Not Currently  Other Topics Concern   Not on file  Social History Narrative   Lives with his wife. In the band at church. Wants to get back in exercise routine but due to his arthritis he is not able to do stand for long periods of time. Enjoys playing his trombone. He enjoys reading and fishing.   Social Drivers of Corporate investment banker Strain: Low Risk  (11/19/2023)   Overall Financial Resource Strain (CARDIA)    Difficulty of Paying Living Expenses: Not hard at all  Food Insecurity: No Food Insecurity (11/19/2023)   Hunger Vital Sign    Worried About Running Out of Food in the Last Year: Never true    Ran Out of Food in the Last Year: Never true  Transportation Needs: No Transportation Needs (11/19/2023)   PRAPARE - Administrator, Civil Service (Medical): No    Lack of Transportation (Non-Medical): No  Physical Activity: Insufficiently Active (11/19/2023)   Exercise Vital Sign    Days of Exercise per Week: 1 day    Minutes of Exercise per Session: 30 min  Stress: No Stress Concern Present (11/19/2023)   [employer] of Occupational Health - Occupational Stress Questionnaire    Feeling of Stress : Not at all  Social Connections: Socially Integrated (11/19/2023)   Social Connection and Isolation Panel [NHANES]    Frequency of Communication with Friends and Family: More than three times a week    Frequency of Social Gatherings with Friends and Family: Twice a week    Attends Religious Services: More than 4 times per year    Active Member of [employer] or Organizations: Yes    Attends Museum/gallery exhibitions officer: More than 4 times per year    Marital Status: Married    Tobacco Counseling Counseling given: Not Answered   Clinical Intake:  Pre-visit preparation completed: Yes  Pain : No/denies pain     BMI - recorded: 25.1 Nutritional Status: BMI 25 -29 Overweight Nutritional Risks: None Diabetes: No  How often do you need to have someone help you when you read instructions, pamphlets, or other written materials from your doctor or pharmacy?: 1 - Never What is the last grade level you completed in school?: 12  Interpreter Needed?: No      Activities of Daily Living    11/19/2023    9:53 AM 11/13/2023   12:08 PM  In your present state of health, do you have any difficulty performing the following activities:  Hearing? 0 0  Vision? 0 0  Difficulty concentrating or making decisions? 0 0  Walking or climbing stairs? 0 0  Dressing or bathing? 0 0  Doing errands, shopping? 0 0  Preparing Food and eating ? N N  Using the Toilet? N N  In the past six months, have you accidently leaked urine? N N  Do you have problems with loss of bowel control? N N  Managing your Medications? N N  Managing your Finances? N N  Housekeeping or managing your Housekeeping? N N    Patient Care Team: Agapito Games, MD as PCP - General  Indicate any recent Medical Services you may have received from other than [facility] providers in the past year (date may be approximate).     Assessment:   This is a routine wellness examination for [facility].  Hearing/Vision screen No results found.   Goals Addressed             This Visit's Progress    Activity and Exercise Increased       He would like to increase exercise.       Depression Screen    11/19/2023    9:58 AM 11/14/2022    9:32 AM 09/13/2022    3:11 PM 01/16/2022    8:13 AM 11/10/2021    9:36 AM 09/18/2021    7:34 AM 11/04/2020    9:06 AM  PHQ 2/9 Scores  PHQ - 2 Score 0 0 1 1 0 1 0    Fall Risk    11/19/2023     9:58 AM 11/13/2023   12:08 PM 11/14/2022    9:31 AM 11/10/2022    9:40 AM 09/13/2022    3:11 PM  Fall Risk   Falls in the past year? 1 1 1  0 0  Number falls in past yr: 0 0 0 0 0  Injury with Fall? 0 0 0 0 0  Risk for fall due to : No Fall Risks  History of fall(s)  No Fall Risks  Follow up Falls evaluation completed  Falls evaluation completed;Education provided;Falls prevention discussed  Falls evaluation completed    MEDICARE RISK AT HOME: Medicare Risk at Home Any stairs in or around the home?: No If so, are there any without handrails?: No Home free of loose throw rugs in walkways, pet beds, electrical cords, etc?: Yes Adequate lighting in your home to reduce risk of falls?: Yes Life alert?: No Use of a cane, walker or w/c?: No Grab bars in the bathroom?: Yes Shower chair or bench in shower?: Yes Elevated toilet seat or a handicapped toilet?: No  TIMED UP AND GO:  Was the test performed?  No    Cognitive Function:        11/19/2023    9:59 AM 11/14/2022    9:32 AM 11/10/2021    9:44 AM 11/04/2020    9:17 AM 05/28/2018   11:12 AM  6CIT Screen  What Year? 0 points 0 points 0 points 0 points 0 points  What month? 0 points 0 points 0 points 0 points 0 points  What time? 0 points 0 points 0 points 0 points 0 points  Count back from 20 0 points 0 points 0 points 0  points 0 points  Months in reverse 0 points 0 points 0 points 0 points 0 points  Repeat phrase 0 points 0 points 0 points 0 points 0 points  Total Score 0 points 0 points 0 points 0 points 0 points    Immunizations Immunization History  Administered Date(s) Administered   Fluad Quad(high Dose 65+) 07/01/2019, 06/10/2020, 07/05/2021, 07/02/2022   Fluad Trivalent(High Dose 65+) 07/02/2023   Influenza, High Dose Seasonal PF 06/12/2017   Influenza,inj,Quad PF,6+ Mos 07/02/2013, 06/11/2014, 10/17/2015, 04/17/2016, 05/28/2018   PFIZER(Purple Top)SARS-COV-2 Vaccination 10/02/2019, 10/27/2019, 06/07/2020   Pfizer Covid-19  Vaccine Bivalent Booster 33yrs & up 06/23/2021   Pfizer(Comirnaty)Fall Seasonal Vaccine 12 years and older 09/27/2022, 07/02/2023   Pneumococcal Conjugate-13 06/11/2014   Pneumococcal Polysaccharide-23 08/13/2004, 10/17/2015   Tdap 03/09/2011, 01/01/2022   Zoster Recombinant(Shingrix) 07/15/2019, 09/16/2019   Zoster, Live 03/09/2011    TDAP status: Up to date  Flu Vaccine status: Up to date  Pneumococcal vaccine status: Up to date  Covid-19 vaccine status: Information provided on how to obtain vaccines.   Qualifies for Shingles Vaccine? Yes   Zostavax completed Yes   Shingrix Completed?: Yes  Screening Tests Health Maintenance  Topic Date Due   COVID-19 Vaccine (7 - Pfizer risk 2024-25 season) 12/30/2023   INFLUENZA VACCINE  03/13/2024   Lung Cancer Screening  05/30/2024   Medicare Annual Wellness (AWV)  11/18/2024   Colonoscopy  11/03/2026   DTaP/Tdap/Td (3 - Td or Tdap) 01/02/2032   Pneumonia Vaccine 52+ Years old  Completed   Hepatitis C Screening  Completed   Zoster Vaccines- Shingrix  Completed   HPV VACCINES  Aged Out    Health Maintenance  There are no preventive care reminders to display for this patient.   Colorectal cancer screening: Type of screening: Colonoscopy. Completed 11/02/2021. Repeat every 5 years  Lung Cancer Screening: (Low Dose CT Chest recommended if Age 39-80 years, 20 pack-year currently smoking OR have quit w/in 15years.) does qualify.   Lung Cancer Screening Referral: Already in the program  Additional Screening:  Hepatitis C Screening: does qualify; Completed 04/17/2016  Vision Screening: Recommended annual ophthalmology exams for early detection of glaucoma and other disorders of the eye. Is the patient up to date with their annual eye exam?  Yes  Who is the provider or what is the name of the office in which the patient attends annual eye exams? [facility] If pt is not established with a provider, would they like to be  referred to a provider to establish care?  N/a .   Dental Screening: Recommended annual dental exams for proper oral hygiene    Community Resource Referral / Chronic Care Management: CRR required this visit?  No   CCM required this visit?  No     Plan:     I have personally reviewed and noted the following in the patient's chart:   Medical and social history Use of alcohol, tobacco or illicit drugs  Current medications and supplements including opioid prescriptions. Patient is not currently taking opioid prescriptions. Functional ability and status Nutritional status Physical activity Advanced directives List of other physicians Hospitalizations, surgeries, and ER visits in previous 12 months. Hospitalization or ER None. Surgery LEFT EYE PHACOEMULSIFICATION  Vitals Screenings to include cognitive, depression, and falls Referrals and appointments  In addition, I have reviewed and discussed with patient certain preventive protocols, quality metrics, and best practice recommendations. A written personalized care plan for preventive services as well as general preventive health recommendations were  provided to patient.     Esmond Harps, CMA   11/19/2023   After Visit Summary: (MyChart) Due to this being a telephonic visit, the after visit summary with patients personalized plan was offered to patient via MyChart   Nurse Notes:   XIOMAR CROMPTON is a 73 y.o. male patient of Metheney, Barbarann Ehlers, MD who had a Medicare Annual Wellness Visit today via telephone. Jj is Retired and lives with their spouse. he has 3 children. He reports that he is socially active and does interact with friends/family regularly. He is moderately physically active and enjoys music, reading and fishing.

## 2023-11-19 NOTE — Patient Instructions (Signed)
  Mr. Dennis Waller , Thank you for taking time to come for your Medicare Wellness Visit. I appreciate your ongoing commitment to your health goals. Please review the following plan we discussed and let me know if I can assist you in the future.   These are the goals we discussed:  Goals       Activity and Exercise Increased      He would like to increase exercise.       Exercise 150 min/wk Moderate Activity      Start back working out in gym for this year      Patient Stated (pt-stated)      11/04/2020 AWV Goal: Improved Nutrition/Diet  Patient will verbalize understanding that diet plays an important role in overall health and that a poor diet is a risk factor for many chronic medical conditions.  Over the next year, patient will improve self management of their diet by incorporating better variety and drink more water. Patient will utilize available community resources to help with food acquisition if needed (ex: food pantries, [address], etc) Patient will work with nutrition specialist if a referral was made       Patient Stated (pt-stated)      Continue to try to be more active and stay healthy.      Patient Stated (pt-stated)      Patient stated that he would like to continue to be ambulatory.         This is a list of the screening recommended for you and due dates:  Health Maintenance  Topic Date Due   COVID-19 Vaccine (7 - Pfizer risk 2024-25 season) 12/30/2023   Flu Shot  03/13/2024   Screening for Lung Cancer  05/30/2024   Medicare Annual Wellness Visit  11/18/2024   Colon Cancer Screening  11/03/2026   DTaP/Tdap/Td vaccine (3 - Td or Tdap) 01/02/2032   Pneumonia Vaccine  Completed   Hepatitis C Screening  Completed   Zoster (Shingles) Vaccine  Completed   HPV Vaccine  Aged Out

## 2023-12-17 DIAGNOSIS — R509 Fever, unspecified: Secondary | ICD-10-CM | POA: Diagnosis not present

## 2023-12-17 DIAGNOSIS — R066 Hiccough: Secondary | ICD-10-CM | POA: Diagnosis not present

## 2023-12-17 DIAGNOSIS — J9811 Atelectasis: Secondary | ICD-10-CM | POA: Diagnosis not present

## 2023-12-17 DIAGNOSIS — Z743 Need for continuous supervision: Secondary | ICD-10-CM | POA: Diagnosis not present

## 2023-12-17 DIAGNOSIS — B9689 Other specified bacterial agents as the cause of diseases classified elsewhere: Secondary | ICD-10-CM | POA: Diagnosis not present

## 2023-12-17 DIAGNOSIS — N179 Acute kidney failure, unspecified: Secondary | ICD-10-CM | POA: Diagnosis not present

## 2023-12-17 DIAGNOSIS — N202 Calculus of kidney with calculus of ureter: Secondary | ICD-10-CM | POA: Diagnosis not present

## 2023-12-17 DIAGNOSIS — Z79899 Other long term (current) drug therapy: Secondary | ICD-10-CM | POA: Diagnosis not present

## 2023-12-17 DIAGNOSIS — D649 Anemia, unspecified: Secondary | ICD-10-CM | POA: Diagnosis not present

## 2023-12-17 DIAGNOSIS — E860 Dehydration: Secondary | ICD-10-CM | POA: Diagnosis not present

## 2023-12-17 DIAGNOSIS — N132 Hydronephrosis with renal and ureteral calculous obstruction: Secondary | ICD-10-CM | POA: Diagnosis not present

## 2023-12-17 DIAGNOSIS — R110 Nausea: Secondary | ICD-10-CM | POA: Diagnosis not present

## 2023-12-17 DIAGNOSIS — Z87891 Personal history of nicotine dependence: Secondary | ICD-10-CM | POA: Diagnosis not present

## 2023-12-17 DIAGNOSIS — N201 Calculus of ureter: Secondary | ICD-10-CM | POA: Diagnosis not present

## 2023-12-17 DIAGNOSIS — N200 Calculus of kidney: Secondary | ICD-10-CM | POA: Diagnosis not present

## 2023-12-17 DIAGNOSIS — A419 Sepsis, unspecified organism: Secondary | ICD-10-CM | POA: Diagnosis not present

## 2023-12-17 DIAGNOSIS — E785 Hyperlipidemia, unspecified: Secondary | ICD-10-CM | POA: Diagnosis not present

## 2023-12-17 DIAGNOSIS — M1990 Unspecified osteoarthritis, unspecified site: Secondary | ICD-10-CM | POA: Diagnosis not present

## 2023-12-17 DIAGNOSIS — E876 Hypokalemia: Secondary | ICD-10-CM | POA: Diagnosis not present

## 2023-12-17 DIAGNOSIS — R262 Difficulty in walking, not elsewhere classified: Secondary | ICD-10-CM | POA: Diagnosis not present

## 2023-12-17 DIAGNOSIS — R531 Weakness: Secondary | ICD-10-CM | POA: Diagnosis not present

## 2023-12-17 DIAGNOSIS — Z7409 Other reduced mobility: Secondary | ICD-10-CM | POA: Diagnosis not present

## 2023-12-17 DIAGNOSIS — K219 Gastro-esophageal reflux disease without esophagitis: Secondary | ICD-10-CM | POA: Diagnosis not present

## 2023-12-17 DIAGNOSIS — R6520 Severe sepsis without septic shock: Secondary | ICD-10-CM | POA: Diagnosis not present

## 2023-12-17 DIAGNOSIS — R000 Tachycardia, unspecified: Secondary | ICD-10-CM | POA: Diagnosis not present

## 2023-12-20 ENCOUNTER — Telehealth: Payer: Self-pay

## 2023-12-20 NOTE — Transitions of Care (Post Inpatient/ED Visit) (Signed)
   12/20/2023  Name: Dennis Waller MRN: [id number] DOB: March 25, 1951  Today's TOC FU Call Status: Today's TOC FU Call Status:: Unsuccessful Call (1st Attempt) Unsuccessful Call (1st Attempt) Date: 12/20/23  Attempted to reach the patient regarding the most recent Inpatient/ED visit.  Follow Up Plan: Additional outreach attempts will be made to reach the patient to complete the Transitions of Care (Post Inpatient/ED visit) call.   Tonia Frankel RN, CCM [place]  VBCI-Population Health RN Care Manager [phone]

## 2023-12-23 ENCOUNTER — Telehealth: Payer: Self-pay

## 2023-12-23 DIAGNOSIS — A419 Sepsis, unspecified organism: Secondary | ICD-10-CM | POA: Insufficient documentation

## 2023-12-23 DIAGNOSIS — N132 Hydronephrosis with renal and ureteral calculous obstruction: Secondary | ICD-10-CM | POA: Insufficient documentation

## 2023-12-23 NOTE — Transitions of Care (Post Inpatient/ED Visit) (Signed)
 12/23/2023  Name: Dennis Waller MRN: [id number] DOB: 1951-03-16  Today's TOC FU Call Status: Today's TOC FU Call Status:: Unsuccessful Call (2nd Attempt) Unsuccessful Call (2nd Attempt) Date: 12/23/23 Patient's Name and Date of Birth confirmed.  Transition Care Management Follow-up Telephone Call How have you been since you were released from the hospital?: Better (patient states he had kidney stone that was lodged and he'd developed sepsis) Any questions or concerns?: No  Items Reviewed: Did you receive and understand the discharge instructions provided?: Yes Medications obtained,verified, and reconciled?: Yes (Medications Reviewed) Any new allergies since your discharge?: No Dietary orders reviewed?: No Do you have support at home?: Yes People in Home [RPT]: spouse, other relative(s) Name of Support/Comfort Primary Source: lives with wife who has Parkinsons and her family lives close by and assists as needed  Medications Reviewed Today: Medications Reviewed Today     Reviewed by Sharmaine Dearth, RN (Registered Nurse) on 12/23/23 at 1517  Med List Status: <None>   Medication Order Taking? Sig Documenting Provider Last Dose Status Informant  acetaminophen  (TYLENOL ) 500 MG tablet [id number] Yes Take 500 mg by mouth every 6 (six) hours as needed. [provider] Taking Active   AMBULATORY NON FORMULARY MEDICATION [id number] Yes Take 1 each by mouth daily. Medication Name: CBD gummy for sleep and arthritis [provider] Taking Active   atorvastatin  (LIPITOR) 40 MG tablet [id number] Yes TAKE 1 TABLET BY MOUTH DAILY Cydney Draft, MD Taking Active   cefdinir (OMNICEF) 300 MG capsule [id number] Yes Take 300 mg by mouth 2 (two) times daily. [provider] Taking Active   Cyanocobalamin (VITAMIN B 12 PO) [id number] Yes Take by mouth. [provider] Taking Active   Ferrous Sulfate Dried (FERROUS SULFATE IRON PO) [id number] Yes Take by mouth.  [provider] Taking Active   ibuprofen (ADVIL) 200 MG tablet [id number] Yes Take 200 mg by mouth every 6 (six) hours as needed. [provider] Taking Active   ondansetron  (ZOFRAN ) 4 MG tablet [id number] Yes Take 4 mg by mouth every 4 (four) hours as needed for nausea or vomiting. [provider] Taking Active   oxycodone (OXY-IR) 5 MG capsule [id number] Yes Take 5 mg by mouth every 6 (six) hours as needed for pain. [provider] Taking Active   tamsulosin  (FLOMAX ) 0.4 MG CAPS capsule [id number] Yes Take 0.4 mg by mouth daily. [provider] Taking Active             Home Care and Equipment/Supplies: Were Home Health Services Ordered?: No Any new equipment or medical supplies ordered?: No  Functional Questionnaire: Do you need assistance with bathing/showering or dressing?: No Do you need assistance with meal preparation?: No Do you need assistance with eating?: No Do you have difficulty maintaining continence: No Do you need assistance with getting out of bed/getting out of a chair/moving?: No Do you have difficulty managing or taking your medications?: No  Follow up appointments reviewed: PCP Follow-up appointment confirmed?: Yes Date of PCP follow-up appointment?: 12/26/23 Follow-up Provider: Lucky Sable Follow-up appointment confirmed?: NA Do you need transportation to your follow-up appointment?: No Do you understand care options if your condition(s) worsen?: Yes-patient verbalized understanding  SDOH Interventions Today    Flowsheet Row Most Recent Value  SDOH Interventions   Food Insecurity Interventions Intervention Not Indicated  Housing Interventions Intervention Not Indicated  Transportation Interventions Intervention Not Indicated  Utilities Interventions Intervention Not Indicated      Patient verbalized understanding  of his medications, has PCP follow up appointment scheduled 12/26/23  and is calling to schedule urology appointment with Dr Brendan Call related to stent that was placed while inpatient and discuss plan regarding kidney stone. Patient denied need for TOC enrollment and has TOC RN's name/number should questions arise.   Tonia Frankel RN, CCM [place]  VBCI-Population Health RN Care Manager [phone]

## 2023-12-25 DIAGNOSIS — N200 Calculus of kidney: Secondary | ICD-10-CM | POA: Diagnosis not present

## 2023-12-26 ENCOUNTER — Ambulatory Visit (INDEPENDENT_AMBULATORY_CARE_PROVIDER_SITE_OTHER): Admitting: Family Medicine

## 2023-12-26 VITALS — BP 132/78 | HR 66 | Ht 69.0 in | Wt 166.1 lb

## 2023-12-26 DIAGNOSIS — N179 Acute kidney failure, unspecified: Secondary | ICD-10-CM

## 2023-12-26 DIAGNOSIS — Z87442 Personal history of urinary calculi: Secondary | ICD-10-CM | POA: Diagnosis not present

## 2023-12-26 DIAGNOSIS — E876 Hypokalemia: Secondary | ICD-10-CM | POA: Diagnosis not present

## 2023-12-26 NOTE — Assessment & Plan Note (Addendum)
 He will be scheduled in the nxt couple fo weeks for stone extraction with Dr. Brendan Call.  Will add Dr. Brendan Call to his new care team since he will be his new urologist.  He says he is doing well he has not had any pain since they put the stent in his urine is almost clear he is pushing lots of fluids and hydrating well.  Due for repeat labs today his potassium was really low and his serum creatinine was quite elevated upon admission.  He is back on the Flomax  and should complete his cefdinir by Monday.  So just encouraged him to let us  know if she if he has any problems into next week.

## 2023-12-26 NOTE — Progress Notes (Signed)
   Established Patient Office Visit  Subjective  Patient ID: Dennis Waller, male    DOB: 1950/09/01  Age: 73 y.o. MRN: [id number]  Chief Complaint  Patient presents with   Hospitalization Follow-up    HPI  Dennis Waller is here today for hospital follow-up.  He was admitted to [facility] health on May 6 and discharged home on May 8 he was diagnosed with sepsis with septic shock and hydronephrosis with renal calculus.  He also had acute kidney injury at that time as well as some electrolyte imbalance.  They sent him home with Omnicef, Zofran  and oxycodone for pain and Flomax .  He underwent ureteral stent placement with urology, Dr. Brendan Call on May 6.  Urine culture came back positive for Enterobacter cloacae a that was pansensitive white blood cell count was elevated to 18,000.  He will need repeat CBC and BMP.    ROS    Objective:     BP 132/78   Pulse 66   Ht 5\' 9"  (1.753 m)   Wt 166 lb 1.9 oz (75.4 kg)   SpO2 99%   BMI 24.53 kg/m    Physical Exam Vitals and nursing note reviewed.  Constitutional:      Appearance: Normal appearance.  HENT:     Head: Normocephalic and atraumatic.  Eyes:     Conjunctiva/sclera: Conjunctivae normal.  Cardiovascular:     Rate and Rhythm: Normal rate and regular rhythm.  Pulmonary:     Effort: Pulmonary effort is normal.     Breath sounds: Normal breath sounds.  Skin:    General: Skin is warm and dry.  Neurological:     Mental Status: He is alert.  Psychiatric:        Mood and Affect: Mood normal.      No results found for any visits on 12/26/23.    The 10-year ASCVD risk score (Arnett DK, et al., 2019) is: 24.1%    Assessment & Plan:   Problem List Items Addressed This Visit       Other   History of kidney stones   He will be scheduled in the nxt couple fo weeks for stone extraction with Dr. Brendan Call.  Will add Dr. Brendan Call to his new care team since he will be his new urologist.  He says he is doing well he has not had any pain  since they put the stent in his urine is almost clear he is pushing lots of fluids and hydrating well.  Due for repeat labs today his potassium was really low and his serum creatinine was quite elevated upon admission.  He is back on the Flomax  and should complete his cefdinir by Monday.  So just encouraged him to let us  know if she if he has any problems into next week.      Other Visit Diagnoses       Hypokalemia    -  Primary   Relevant Orders   Basic Metabolic Panel (BMET)   CBC with Differential/Platelet     AKI (acute kidney injury) (HCC)       Relevant Orders   Basic Metabolic Panel (BMET)   CBC with Differential/Platelet      I spent 35 minutes on the day of the encounter to include pre-visit record review, face-to-face time with the patient and post visit ordering of test.   No follow-ups on file.    Duaine German, MD

## 2023-12-27 ENCOUNTER — Ambulatory Visit: Payer: Self-pay | Admitting: Family Medicine

## 2023-12-27 LAB — CBC WITH DIFFERENTIAL/PLATELET
Basophils Absolute: 0.1 10*3/uL (ref 0.0–0.2)
Basos: 1 %
EOS (ABSOLUTE): 0.2 10*3/uL (ref 0.0–0.4)
Eos: 3 %
Hematocrit: 32.5 % — ABNORMAL LOW (ref 37.5–51.0)
Hemoglobin: 10.2 g/dL — ABNORMAL LOW (ref 13.0–17.7)
Immature Grans (Abs): 0 10*3/uL (ref 0.0–0.1)
Immature Granulocytes: 1 %
Lymphocytes Absolute: 1.4 10*3/uL (ref 0.7–3.1)
Lymphs: 23 %
MCH: 32.3 pg (ref 26.6–33.0)
MCHC: 31.4 g/dL — ABNORMAL LOW (ref 31.5–35.7)
MCV: 103 fL — ABNORMAL HIGH (ref 79–97)
Monocytes Absolute: 0.7 10*3/uL (ref 0.1–0.9)
Monocytes: 11 %
Neutrophils Absolute: 3.8 10*3/uL (ref 1.4–7.0)
Neutrophils: 61 %
Platelets: 365 10*3/uL (ref 150–450)
RBC: 3.16 x10E6/uL — ABNORMAL LOW (ref 4.14–5.80)
RDW: 12.9 % (ref 11.6–15.4)
WBC: 6.1 10*3/uL (ref 3.4–10.8)

## 2023-12-27 LAB — BASIC METABOLIC PANEL WITH GFR
BUN/Creatinine Ratio: 12 (ref 10–24)
BUN: 16 mg/dL (ref 8–27)
CO2: 24 mmol/L (ref 20–29)
Calcium: 8.6 mg/dL (ref 8.6–10.2)
Chloride: 102 mmol/L (ref 96–106)
Creatinine, Ser: 1.33 mg/dL — ABNORMAL HIGH (ref 0.76–1.27)
Glucose: 84 mg/dL (ref 70–99)
Potassium: 4.3 mmol/L (ref 3.5–5.2)
Sodium: 140 mmol/L (ref 134–144)
eGFR: 56 mL/min/{1.73_m2} — ABNORMAL LOW (ref >59)

## 2023-12-27 LAB — SPECIMEN STATUS REPORT

## 2023-12-27 NOTE — Progress Notes (Signed)
 Hi Dennis Waller, your renal function is stable. Hemoglobin stable at 10.2

## 2023-12-30 ENCOUNTER — Telehealth: Payer: Self-pay

## 2023-12-30 NOTE — Telephone Encounter (Signed)
 Patient came into office to drop off Handicap Placard from to be completed by Dr. Greer Leak, forms placed in her box, thanks.

## 2023-12-30 NOTE — Telephone Encounter (Signed)
 Please clarify what condition needed it for.

## 2024-01-04 DIAGNOSIS — R6889 Other general symptoms and signs: Secondary | ICD-10-CM | POA: Diagnosis not present

## 2024-01-04 DIAGNOSIS — K5730 Diverticulosis of large intestine without perforation or abscess without bleeding: Secondary | ICD-10-CM | POA: Diagnosis not present

## 2024-01-04 DIAGNOSIS — Z8619 Personal history of other infectious and parasitic diseases: Secondary | ICD-10-CM | POA: Diagnosis not present

## 2024-01-04 DIAGNOSIS — Z87891 Personal history of nicotine dependence: Secondary | ICD-10-CM | POA: Diagnosis not present

## 2024-01-04 DIAGNOSIS — R531 Weakness: Secondary | ICD-10-CM | POA: Diagnosis not present

## 2024-01-04 DIAGNOSIS — R7989 Other specified abnormal findings of blood chemistry: Secondary | ICD-10-CM | POA: Diagnosis not present

## 2024-01-04 DIAGNOSIS — N1330 Unspecified hydronephrosis: Secondary | ICD-10-CM | POA: Diagnosis not present

## 2024-01-04 DIAGNOSIS — N132 Hydronephrosis with renal and ureteral calculous obstruction: Secondary | ICD-10-CM | POA: Diagnosis not present

## 2024-01-04 DIAGNOSIS — N3001 Acute cystitis with hematuria: Secondary | ICD-10-CM | POA: Diagnosis not present

## 2024-01-04 DIAGNOSIS — Z87442 Personal history of urinary calculi: Secondary | ICD-10-CM | POA: Diagnosis not present

## 2024-01-04 DIAGNOSIS — I2510 Atherosclerotic heart disease of native coronary artery without angina pectoris: Secondary | ICD-10-CM | POA: Diagnosis not present

## 2024-01-04 DIAGNOSIS — R110 Nausea: Secondary | ICD-10-CM | POA: Diagnosis not present

## 2024-01-04 DIAGNOSIS — N200 Calculus of kidney: Secondary | ICD-10-CM | POA: Diagnosis not present

## 2024-01-04 DIAGNOSIS — Z743 Need for continuous supervision: Secondary | ICD-10-CM | POA: Diagnosis not present

## 2024-01-04 DIAGNOSIS — Z960 Presence of urogenital implants: Secondary | ICD-10-CM | POA: Diagnosis not present

## 2024-01-04 DIAGNOSIS — I700 Atherosclerosis of aorta: Secondary | ICD-10-CM | POA: Diagnosis not present

## 2024-01-14 NOTE — Telephone Encounter (Signed)
 Form completed,copied and sent to scanning original given to pt's wife.

## 2024-01-20 DIAGNOSIS — Z87891 Personal history of nicotine dependence: Secondary | ICD-10-CM | POA: Diagnosis not present

## 2024-01-20 DIAGNOSIS — Z79899 Other long term (current) drug therapy: Secondary | ICD-10-CM | POA: Diagnosis not present

## 2024-01-20 DIAGNOSIS — Z87442 Personal history of urinary calculi: Secondary | ICD-10-CM | POA: Diagnosis not present

## 2024-01-20 DIAGNOSIS — Z466 Encounter for fitting and adjustment of urinary device: Secondary | ICD-10-CM | POA: Diagnosis not present

## 2024-01-20 DIAGNOSIS — N200 Calculus of kidney: Secondary | ICD-10-CM | POA: Diagnosis not present

## 2024-01-23 ENCOUNTER — Ambulatory Visit (INDEPENDENT_AMBULATORY_CARE_PROVIDER_SITE_OTHER): Admitting: Family Medicine

## 2024-01-23 ENCOUNTER — Encounter: Payer: Self-pay | Admitting: Family Medicine

## 2024-01-23 VITALS — BP 147/65 | HR 55 | Wt 159.7 lb

## 2024-01-23 DIAGNOSIS — N3090 Cystitis, unspecified without hematuria: Secondary | ICD-10-CM | POA: Diagnosis not present

## 2024-01-23 DIAGNOSIS — M79602 Pain in left arm: Secondary | ICD-10-CM

## 2024-01-23 DIAGNOSIS — K5903 Drug induced constipation: Secondary | ICD-10-CM

## 2024-01-23 NOTE — Progress Notes (Signed)
 Established Patient Office Visit  Subjective  Patient ID: Dennis Waller, male    DOB: 1951/07/13  Age: 73 y.o. MRN: [id number]  No chief complaint on file.   HPI  For follow-up after recent ED visit.  He went to [facility] on May 24 for fever and not feeling well couple of weeks prior to that he had been seen for a obstructive stone and had a stent placed and had sepsis.  Chest x-ray and CT of abdomen were normal.  He was diagnosed with acute cystitis and given a 10-day supply of Bactrim  to complete at home.  Urine culture was positive for Enterobacter cloacae.  Had Stent removed on Monday and has been feeling better since then. He is able to eat now and has been staying hydrated. He has not had a bowel movement since Monday 6/9 but denies abdominal pain. Denies fever, chills, frequency, urgency or blood in the urine.   He hit his left arm and the left side of his ribcage against a wall a few days ago while trying to carry a box. He did not fall or lose consciousness. States it was pretty tender at first but now the arm is okay and the left side of his ribcage is only mildly tender to the touch. He denies shortness of breath, difficulty breathing, cough or coughing up blood.  He has felt well.      Review of Systems  Constitutional:  Negative for chills, fever and malaise/fatigue.  Respiratory:  Negative for cough, hemoptysis and shortness of breath.   Cardiovascular:  Negative for chest pain, palpitations and leg swelling.  Gastrointestinal:  Positive for constipation. Negative for abdominal pain, diarrhea, nausea and vomiting.  Genitourinary:  Negative for dysuria, flank pain, frequency, hematuria and urgency.  Musculoskeletal:  Negative for back pain.      Objective:     BP (!) 147/65 (BP Location: Left Arm, Patient Position: Sitting)   Pulse (!) 55   Wt 159 lb 11.2 oz (72.4 kg)   SpO2 98%   BMI 23.58 kg/m     Physical Exam Vitals and nursing note reviewed.   Constitutional:      Appearance: Normal appearance.  HENT:     Head: Normocephalic and atraumatic.   Eyes:     Conjunctiva/sclera: Conjunctivae normal.    Cardiovascular:     Rate and Rhythm: Normal rate and regular rhythm.  Pulmonary:     Effort: Pulmonary effort is normal.     Breath sounds: Normal breath sounds.   Musculoskeletal:     Comments: There is no bruising or crepitus to the left rib cage or left humerus. There is slight tenderness to palpation to the left lateral rib cage over ribs 8 and 9.   Skin:    General: Skin is warm and dry.   Neurological:     Mental Status: He is alert.   Psychiatric:        Mood and Affect: Mood normal.     No results found for any visits on 01/23/24.     The 10-year ASCVD risk score (Arnett DK, et al., 2019) is: 28.4%    Assessment & Plan:   Problem List Items Addressed This Visit   None Visit Diagnoses       Cystitis    -  Primary     Left arm pain         Drug-induced constipation         Cystitis-resolved she is actually  feeling so much better after recent antibiotics and and stent removal he has a follow up again with Dr. Brendan Call, urology in about 3 months.  He wonders if he can stop the Flomax  he should be able to now that the stone is passed but I did encourage him to reach out to Dr. Marlyce Sine office just to make sure he is okay with it.  Arm pain/Rib pain , left -most likely just soft tissue injury from lifting a heavy box it seems to be resolving on its own but if pain persist then please let us  know.  Mild constipation-has not had a bowel movement in the last 3 days.  There is just been a lot going on and he was also on narcotics for short period time did encourage him to use his MiraLAX starting this evening to get his bowels moving he can either start with a half a cap or a whole And continue daily until he has a soft bowel movement.   Return if symptoms worsen or fail to improve.    Duaine German, MD

## 2024-05-20 ENCOUNTER — Ambulatory Visit (INDEPENDENT_AMBULATORY_CARE_PROVIDER_SITE_OTHER)

## 2024-05-20 VITALS — Temp 98.2°F

## 2024-05-20 DIAGNOSIS — Z23 Encounter for immunization: Secondary | ICD-10-CM | POA: Diagnosis not present

## 2024-05-21 ENCOUNTER — Ambulatory Visit

## 2024-06-01 ENCOUNTER — Ambulatory Visit

## 2024-06-01 DIAGNOSIS — Z122 Encounter for screening for malignant neoplasm of respiratory organs: Secondary | ICD-10-CM | POA: Diagnosis not present

## 2024-06-01 DIAGNOSIS — Z87891 Personal history of nicotine dependence: Secondary | ICD-10-CM

## 2024-06-04 ENCOUNTER — Other Ambulatory Visit: Payer: Self-pay

## 2024-06-04 DIAGNOSIS — Z122 Encounter for screening for malignant neoplasm of respiratory organs: Secondary | ICD-10-CM

## 2024-06-04 DIAGNOSIS — Z87891 Personal history of nicotine dependence: Secondary | ICD-10-CM

## 2024-06-18 ENCOUNTER — Encounter: Payer: Self-pay | Admitting: Urgent Care

## 2024-06-18 ENCOUNTER — Ambulatory Visit (INDEPENDENT_AMBULATORY_CARE_PROVIDER_SITE_OTHER): Admitting: Urgent Care

## 2024-06-18 VITALS — BP 104/56 | HR 79 | Temp 100.6°F | Ht 69.0 in | Wt 165.0 lb

## 2024-06-18 DIAGNOSIS — N390 Urinary tract infection, site not specified: Secondary | ICD-10-CM | POA: Diagnosis not present

## 2024-06-18 DIAGNOSIS — R350 Frequency of micturition: Secondary | ICD-10-CM

## 2024-06-18 LAB — POCT URINALYSIS DIP (CLINITEK)
Bilirubin, UA: NEGATIVE
Glucose, UA: NEGATIVE mg/dL
Ketones, POC UA: NEGATIVE mg/dL
Nitrite, UA: NEGATIVE
POC PROTEIN,UA: >=300 — AB
Spec Grav, UA: 1.02 (ref 1.010–1.025)
Urobilinogen, UA: 0.2 U/dL
pH, UA: 5.5 (ref 5.0–8.0)

## 2024-06-18 MED ORDER — CIPROFLOXACIN HCL 500 MG PO TABS: 500.0000 mg | ORAL_TABLET | Freq: Two times a day (BID) | ORAL | 0 refills | Status: AC

## 2024-06-18 MED ORDER — CEFTRIAXONE SODIUM 1 G IJ SOLR
1.0000 g | Freq: Once | INTRAMUSCULAR | Status: AC
  Administered 2024-06-18: 1 g via INTRAMUSCULAR

## 2024-06-18 NOTE — Progress Notes (Unsigned)
   Established Patient Office Visit  Subjective:  Patient ID: Dennis Waller, male    DOB: 11-24-1950  Age: 73 y.o. MRN: [id number]  Chief Complaint  Patient presents with  . Urinary Frequency    Burning sensation - onset Saturday - urination has become less and less over the past few days   . Fever    100.4 an hour ago -  took tylenol      HPI  {History (Optional):23778}  ROS: as noted in HPI  Objective:     BP (!) 104/56 (BP Location: Left Arm, Patient Position: Sitting, Cuff Size: Normal)   Pulse 79   Temp (!) 100.6 F (38.1 C) (Oral)   Ht 5' 9 (1.753 m)   Wt 165 lb (74.8 kg)   SpO2 98%   BMI 24.37 kg/m  BP Readings from Last 3 Encounters:  06/18/24 (!) 104/56  01/23/24 (!) 147/65  12/26/23 132/78   Wt Readings from Last 3 Encounters:  06/18/24 165 lb (74.8 kg)  06/01/24 159 lb (72.1 kg)  01/23/24 159 lb 11.2 oz (72.4 kg)      Physical Exam   No results found for any visits on 06/18/24.  Last CBC Lab Results  Component Value Date   WBC 6.1 12/26/2023   HGB 10.2 (L) 12/26/2023   HCT 32.5 (L) 12/26/2023   MCV 103 (H) 12/26/2023   MCH 32.3 12/26/2023   RDW 12.9 12/26/2023   PLT 365 12/26/2023   Last metabolic panel Lab Results  Component Value Date   GLUCOSE 84 12/26/2023   NA 140 12/26/2023   K 4.3 12/26/2023   CL 102 12/26/2023   CO2 24 12/26/2023   BUN 16 12/26/2023   CREATININE 1.33 (H) 12/26/2023   EGFR 56 (L) 12/26/2023   CALCIUM  8.6 12/26/2023   PROT 6.9 09/24/2022   ALBUMIN 4.6 04/17/2016   BILITOT 0.4 09/24/2022   ALKPHOS 64 04/17/2016   AST 13 09/24/2022   ALT 9 09/24/2022   Last lipids Lab Results  Component Value Date   CHOL 147 09/24/2022   HDL 38 (L) 09/24/2022   LDLCALC 78 09/24/2022   TRIG 225 (H) 09/24/2022   CHOLHDL 3.9 09/24/2022   Last hemoglobin A1c Lab Results  Component Value Date   HGBA1C 5.6 12/29/2019   Last thyroid  functions Lab Results  Component Value Date   TSH 1.94 09/18/2021   Last  vitamin D No results found for: 25OHVITD2, 25OHVITD3, VD25OH Last vitamin B12 and Folate Lab Results  Component Value Date   VITAMINB12 329 07/17/2021      The 10-year ASCVD risk score (Arnett DK, et al., 2019) is: 17.7%  Assessment & Plan:  Urinary frequency -     POCT URINALYSIS DIP (CLINITEK) -     Urine Culture     No follow-ups on file.   Benton LITTIE Gave, PA

## 2024-06-18 NOTE — Patient Instructions (Signed)
 Please start taking cipro twice daily. Drink plenty of water while on this medication. If you develop severe ankle/ tendon pain while on, please stop and notify our office.  Please return in 1 day for recheck

## 2024-06-19 ENCOUNTER — Encounter: Payer: Self-pay | Admitting: Urgent Care

## 2024-06-19 ENCOUNTER — Ambulatory Visit (INDEPENDENT_AMBULATORY_CARE_PROVIDER_SITE_OTHER): Admitting: Urgent Care

## 2024-06-19 ENCOUNTER — Ambulatory Visit: Payer: Self-pay | Admitting: Urgent Care

## 2024-06-19 VITALS — BP 111/68 | HR 73 | Temp 98.0°F | Ht 69.0 in

## 2024-06-19 DIAGNOSIS — N390 Urinary tract infection, site not specified: Secondary | ICD-10-CM

## 2024-06-19 DIAGNOSIS — N179 Acute kidney failure, unspecified: Secondary | ICD-10-CM

## 2024-06-19 MED ORDER — CEFTRIAXONE SODIUM 1 G IJ SOLR
1.0000 g | Freq: Once | INTRAMUSCULAR | Status: AC
  Administered 2024-06-19: 1 g via INTRAMUSCULAR

## 2024-06-19 NOTE — Patient Instructions (Signed)
 You were given a second ceftriaxone shot today. Please continue your cipro as previously ordered.  Please call your urologist to set up a follow up visit. We will send your results via mychart this weekend with further recommendations.

## 2024-06-19 NOTE — Progress Notes (Signed)
 Established Patient Office Visit  Subjective:  Patient ID: Dennis Waller, male    DOB: July 18, 1951  Age: 73 y.o. MRN: [id number]  Chief Complaint  Patient presents with   Follow-up    73yo male presents today for one day follow-up s/p recent complicated UTI. States his fever broke yesterday. His weakness has improved significantly. Yesterday slept most of the day, today has much more energy. Is able to drink fluids. Appetite good. Took 2 cipro yesterday, 1 today. No new complaints, overall feeling significant improvement.    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 Parnell   Arthritis years ago   Blood pressure check 09/27/2022   Cataract    Chronic kidney disease    kidney stones   GERD (gastroesophageal reflux disease) It returned while I was in the hospital  Zantac has relieved the symptons   Hyperlipidemia    Neuromuscular disorder (HCC) years   neuropathy in legs and feet   Past Surgical History:  Procedure Laterality Date   BACK SURGERY  08/14/1995   ? lumbar disc fusion   FRACTURE SURGERY  23yrs ago   broken nose   KIDNEY STONE  SURGERY     LEFT EYE PHACOEMULSIFICATION Left 01/24/2023   SPINE SURGERY  40 years ago had a bulging disc problem taken care of   Social History   Tobacco Use   Smoking status: Former    Current packs/day: 0.00    Average packs/day: 2.0 packs/day for 55.0 years (110.0 ttl pk-yrs)    Types: Cigarettes    Start date: 11/08/1960    Quit date: 11/09/2015    Years since quitting: 8.6   Smokeless tobacco: Never  Vaping Use   Vaping status: Never Used  Substance Use Topics   Alcohol use: Not Currently    Comment: very seldom   Drug use: No      ROS: as noted in HPI  Objective:     BP 111/68   Pulse 73   Temp 98 F (36.7 C) (Oral)   Ht 5' 9 (1.753 m)   SpO2 99%   BMI 24.37 kg/m  BP Readings from Last 3 Encounters:  06/19/24 111/68  06/18/24 (!) 104/56  01/23/24 (!) 147/65   Wt Readings from Last 3 Encounters:  06/18/24 165 lb (74.8 kg)  06/01/24 159 lb (72.1 kg)  01/23/24 159 lb 11.2 oz (72.4 kg)      Physical Exam Vitals and nursing note reviewed. Exam conducted with a chaperone present.  Constitutional:      General: He is not in acute distress.    Appearance: Normal appearance. He is not ill-appearing, toxic-appearing or diaphoretic.  HENT:     Head: Normocephalic and atraumatic.  Mouth/Throat:     Mouth: Mucous membranes are moist.  Eyes:     General:        Right eye: No discharge.        Left eye: No discharge.  Cardiovascular:     Rate and Rhythm: Normal rate and regular rhythm.  Abdominal:     General: Abdomen is flat. There is no distension.     Palpations: Abdomen is soft.     Tenderness: There is no abdominal tenderness. There is no right CVA tenderness, left CVA tenderness, guarding or rebound.  Lymphadenopathy:     Cervical: No cervical adenopathy.  Skin:    General: Skin is warm and dry.     Findings: No rash.  Neurological:     Mental Status: He is alert and oriented to person, place, and time.     Gait: Gait normal.  Psychiatric:         Mood and Affect: Mood normal.        Behavior: Behavior normal.      No results found for any visits on 06/19/24.  Last CBC Lab Results  Component Value Date   WBC 23.4 (HH) 06/18/2024   HGB 10.3 (L) 06/18/2024   HCT 31.1 (L) 06/18/2024   MCV 99 (H) 06/18/2024   MCH 32.7 06/18/2024   RDW 11.8 06/18/2024   PLT 306 06/18/2024   Last metabolic panel Lab Results  Component Value Date   GLUCOSE 96 06/18/2024   NA 138 06/18/2024   K 4.4 06/18/2024   CL 102 06/18/2024   CO2 18 (L) 06/18/2024   BUN 23 06/18/2024   CREATININE 2.16 (H) 06/18/2024   EGFR 31 (L) 06/18/2024   CALCIUM  9.1 06/18/2024   PROT 6.5 06/18/2024   ALBUMIN 4.4 06/18/2024   LABGLOB 2.1 06/18/2024   BILITOT 0.5 06/18/2024   ALKPHOS 65 06/18/2024   AST 17 06/18/2024   ALT 13 06/18/2024      The 10-year ASCVD risk score (Arnett DK, et al., 2019) is: 19.6%  Assessment & Plan:  Complicated urinary tract infection -     cefTRIAXone Sodium -     CBC with Differential/Platelet  AKI (acute kidney injury)   Pt doing much better. Vitals stable/ improved. Pt tolerating medication. Will recheck CBC. Still awaiting lactic acid from yesterdays lab draw. Given significant improvement, do not feel inpatient tx indicated. Pt has urologist, recommended he contact them to schedule follow up consult Recommended pt take OTC cranberry for prevention. Additional IM rocephin today. Continue with cipro x 7 days total. AKI suspected to improve from hydration. Will recheck in one week. ER for any new/ worsening sx.   No follow-ups on file.   Benton LITTIE Gave, PA

## 2024-06-20 ENCOUNTER — Ambulatory Visit: Payer: Self-pay | Admitting: Urgent Care

## 2024-06-20 LAB — CBC WITH DIFFERENTIAL/PLATELET
Basophils Absolute: 0.1 x10E3/uL (ref 0.0–0.2)
Basos: 0 %
EOS (ABSOLUTE): 0.1 x10E3/uL (ref 0.0–0.4)
Eos: 1 %
Hematocrit: 28.3 % — ABNORMAL LOW (ref 37.5–51.0)
Hemoglobin: 9.4 g/dL — ABNORMAL LOW (ref 13.0–17.7)
Immature Grans (Abs): 0.1 x10E3/uL (ref 0.0–0.1)
Immature Granulocytes: 1 %
Lymphocytes Absolute: 1 x10E3/uL (ref 0.7–3.1)
Lymphs: 5 %
MCH: 32.5 pg (ref 26.6–33.0)
MCHC: 33.2 g/dL (ref 31.5–35.7)
MCV: 98 fL — ABNORMAL HIGH (ref 79–97)
Monocytes Absolute: 1.4 x10E3/uL — ABNORMAL HIGH (ref 0.1–0.9)
Monocytes: 7 %
Neutrophils Absolute: 16.7 x10E3/uL — ABNORMAL HIGH (ref 1.4–7.0)
Neutrophils: 86 %
Platelets: 274 x10E3/uL (ref 150–450)
RBC: 2.89 x10E6/uL — ABNORMAL LOW (ref 4.14–5.80)
RDW: 11.8 % (ref 11.6–15.4)
WBC: 19.4 x10E3/uL — ABNORMAL HIGH (ref 3.4–10.8)

## 2024-06-22 LAB — CMP14+EGFR
ALT: 13 IU/L (ref 0–44)
AST: 17 IU/L (ref 0–40)
Albumin: 4.4 g/dL (ref 3.8–4.8)
Alkaline Phosphatase: 65 IU/L (ref 47–123)
BUN/Creatinine Ratio: 11 (ref 10–24)
BUN: 23 mg/dL (ref 8–27)
Bilirubin Total: 0.5 mg/dL (ref 0.0–1.2)
CO2: 18 mmol/L — ABNORMAL LOW (ref 20–29)
Calcium: 9.1 mg/dL (ref 8.6–10.2)
Chloride: 102 mmol/L (ref 96–106)
Creatinine, Ser: 2.16 mg/dL — ABNORMAL HIGH (ref 0.76–1.27)
Globulin, Total: 2.1 g/dL (ref 1.5–4.5)
Glucose: 96 mg/dL (ref 70–99)
Potassium: 4.4 mmol/L (ref 3.5–5.2)
Sodium: 138 mmol/L (ref 134–144)
Total Protein: 6.5 g/dL (ref 6.0–8.5)
eGFR: 31 mL/min/1.73 — ABNORMAL LOW (ref >59)

## 2024-06-22 LAB — CBC WITH DIFFERENTIAL/PLATELET
Basophils Absolute: 0.1 x10E3/uL (ref 0.0–0.2)
Basos: 0 %
EOS (ABSOLUTE): 0 x10E3/uL (ref 0.0–0.4)
Eos: 0 %
Hematocrit: 31.1 % — ABNORMAL LOW (ref 37.5–51.0)
Hemoglobin: 10.3 g/dL — ABNORMAL LOW (ref 13.0–17.7)
Immature Grans (Abs): 0 x10E3/uL (ref 0.0–0.1)
Immature Granulocytes: 0 %
Lymphocytes Absolute: 0.5 x10E3/uL — ABNORMAL LOW (ref 0.7–3.1)
Lymphs: 2 %
MCH: 32.7 pg (ref 26.6–33.0)
MCHC: 33.1 g/dL (ref 31.5–35.7)
MCV: 99 fL — ABNORMAL HIGH (ref 79–97)
Monocytes Absolute: 0.7 x10E3/uL (ref 0.1–0.9)
Monocytes: 3 %
Neutrophils Absolute: 21.9 x10E3/uL — ABNORMAL HIGH (ref 1.4–7.0)
Neutrophils: 95 %
Platelets: 306 x10E3/uL (ref 150–450)
RBC: 3.15 x10E6/uL — ABNORMAL LOW (ref 4.14–5.80)
RDW: 11.8 % (ref 11.6–15.4)
WBC: 23.4 x10E3/uL (ref 3.4–10.8)

## 2024-06-22 LAB — LACTIC ACID, PLASMA: Lactate, Ven: 9.9 mg/dL (ref 4.8–25.7)

## 2024-06-24 ENCOUNTER — Telehealth: Payer: Self-pay | Admitting: Urgent Care

## 2024-06-24 LAB — URINE CULTURE

## 2024-06-24 NOTE — Telephone Encounter (Signed)
 Staff called patient for a follow up. Was seen in ER on 06/21/24, given IV zosyn  and discharged home. Called pt today to schedule ER follow up and to repeat CBC/ CMP. Pt declined stating he has a follow up with his urologist and did not want to schedule.

## 2024-07-16 ENCOUNTER — Other Ambulatory Visit: Payer: Self-pay | Admitting: Family Medicine

## 2024-07-16 DIAGNOSIS — E785 Hyperlipidemia, unspecified: Secondary | ICD-10-CM

## 2024-08-10 ENCOUNTER — Ambulatory Visit
Admission: EM | Admit: 2024-08-10 | Discharge: 2024-08-10 | Disposition: A | Discharge status: Home / Self Care | Attending: Family Medicine | Admitting: Family Medicine

## 2024-08-10 DIAGNOSIS — R059 Cough, unspecified: Secondary | ICD-10-CM

## 2024-08-10 DIAGNOSIS — J101 Influenza due to other identified influenza virus with other respiratory manifestations: Secondary | ICD-10-CM

## 2024-08-10 LAB — POCT INFLUENZA A/B
Influenza A, POC: POSITIVE — AB
Influenza B, POC: NEGATIVE

## 2024-08-10 LAB — POC SOFIA SARS ANTIGEN FIA: SARS Coronavirus 2 Ag: NEGATIVE

## 2024-08-10 MED ORDER — PREDNISONE 20 MG PO TABS: ORAL_TABLET | ORAL | 0 refills | Status: AC

## 2024-08-10 MED ORDER — PROMETHAZINE-DM 6.25-15 MG/5ML PO SYRP: 5.0000 mL | ORAL_SOLUTION | Freq: Two times a day (BID) | ORAL | 0 refills | Status: AC | PRN

## 2024-08-10 MED ORDER — OSELTAMIVIR PHOSPHATE 75 MG PO CAPS: 75.0000 mg | ORAL_CAPSULE | Freq: Two times a day (BID) | ORAL | 0 refills | Status: AC

## 2024-08-10 NOTE — ED Triage Notes (Signed)
 Pt c/o cough since Fri afternoon. Fever Sat evening. Theraflu prn.

## 2024-08-10 NOTE — Discharge Instructions (Addendum)
 Advised patient take medications as directed with food to completion.  Advised take prednisone  with first dose of Tamiflu  for the next 5 days.  Advised may use Promethazine DM cough syrup at night for cough prior to sleep.  Patient has been advised of sedative effects of this medication.  Encouraged to increase daily water intake to 64 ounces per day while taking these medications.  Advised if symptoms worsen and/or unresolved please follow-up with your PCP or here for further evaluation.

## 2024-08-10 NOTE — ED Provider Notes (Signed)
 " [name]    CSN: [id number] Arrival date & time: 08/10/24  9040      History   Chief Complaint Chief Complaint  Patient presents with   Cough    HPI Dennis Waller is a 73 y.o. male.   HPI Very pleasant 73 year old male presents with chest congestion cough and fever for 3 days.  PMH significant for neuromuscular disorder, CKD, and neuropathy involving both lower extremities.  Patient is accompanied by his wife this morning.  Past Medical History:  Diagnosis Date   Anemia check with Dr. 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 lumbar disc fusion   FRACTURE SURGERY  52yrs ago   broken nose   KIDNEY STONE SURGERY     LEFT EYE PHACOEMULSIFICATION Left 01/24/2023   SPINE SURGERY  40  years ago had a bulging disc problem taken care of       Home Medications    Prior to Admission medications  Medication Sig Start Date End Date Taking? Authorizing Provider  oseltamivir  (TAMIFLU ) 75 MG capsule Take 1 capsule (75 mg total) by mouth every 12 (twelve) hours. 08/10/24  Yes Teddy Sharper, FNP  predniSONE  (DELTASONE ) 20 MG tablet Take 3 tabs PO daily x 5 days. 08/10/24  Yes Teddy Sharper, FNP  promethazine-dextromethorphan (PROMETHAZINE-DM) 6.25-15 MG/5ML syrup Take 5 mLs by mouth 2 (two) times daily as needed for cough. 08/10/24  Yes Teddy Sharper, FNP  acetaminophen  (TYLENOL ) 500 MG tablet Take 500 mg by mouth every 6 (six) hours as needed.    [provider]  AMBULATORY NON FORMULARY MEDICATION Take 1 each by mouth daily. Medication Name: CBD gummy for sleep and arthritis    [provider]  atorvastatin  (LIPITOR) 40 MG tablet TAKE 1 TABLET BY MOUTH DAILY 07/17/24   Metheney, Catherine D, MD  Cyanocobalamin (VITAMIN B 12 PO) Take by mouth.    [provider]  Ferrous Sulfate Dried (FERROUS SULFATE IRON PO) Take by mouth.    [provider]    Family History Family History  Problem Relation Age of Onset   Hyperlipidemia Father    Hypertension Father    Arthritis Father    Diabetes Mother    Hypertension Mother    Cancer Mother        colon  COPD Mother    Arthritis Brother    Diabetes Sister     Social History Social History[1]   Allergies   Patient has no known allergies.   Review of Systems Review of Systems  HENT:  Positive for congestion.   Respiratory:  Positive for cough.   All other systems reviewed and are negative.    Physical Exam Triage Vital Signs ED Triage Vitals  Encounter Vitals Group     BP      Girls Systolic BP Percentile      Girls Diastolic BP Percentile      Boys Systolic BP Percentile      Boys Diastolic BP Percentile      Pulse      Resp      Temp      Temp src      SpO2       Weight      Height      Head Circumference      Peak Flow      Pain Score      Pain Loc      Pain Education      Exclude from Growth Chart    No data found.  Updated Vital Signs BP 135/77 (BP Location: Right Arm)   Pulse 83   Temp 99.3 F (37.4 C) (Oral)   Resp 17   SpO2 96%    Physical Exam Vitals and nursing note reviewed.  Constitutional:      Appearance: Normal appearance. He is normal weight. He is ill-appearing.  HENT:     Head: Normocephalic and atraumatic.     Right Ear: Tympanic membrane, ear canal and external ear normal.     Left Ear: Tympanic membrane, ear canal and external ear normal.     Nose: Nose normal.     Mouth/Throat:     Mouth: Mucous membranes are dry.     Pharynx: Oropharynx is clear.  Eyes:     Extraocular Movements: Extraocular movements intact.     Conjunctiva/sclera: Conjunctivae normal.     Pupils: Pupils are equal, round, and reactive to light.  Cardiovascular:     Rate and Rhythm: Normal rate and regular rhythm.     Pulses: Normal pulses.     Heart sounds: Normal heart sounds. No murmur heard. Pulmonary:     Effort: Pulmonary effort is normal.     Breath sounds: Normal breath sounds. No wheezing, rhonchi or rales.     Comments: Frequent nonproductive cough on exam Musculoskeletal:        General: Normal range of motion.  Skin:    General: Skin is warm and dry.  Neurological:     General: No focal deficit present.     Mental Status: He is alert and oriented to person, place, and time.  Psychiatric:        Mood and Affect: Mood normal.        Behavior: Behavior normal.      UC Treatments / Results  Labs (all labs ordered are listed, but only abnormal results are displayed) Labs Reviewed  POCT INFLUENZA A/B - Abnormal; Notable for the following components:      Result Value   Influenza A, POC Positive (*)    All other components within normal limits  POC SOFIA SARS ANTIGEN FIA    EKG   Radiology No results  found.  Procedures Procedures (including critical care time)  Medications Ordered in UC Medications - No data to display  Initial Impression / Assessment and Plan / UC Course  I have reviewed the triage vital signs and the nursing notes.  Pertinent labs & imaging results that were available during my care of the patient were reviewed by me and considered in my medical decision making (see chart for details).     MDM: 1.  Influenza A-Rx'd Tamiflu  75 mg capsule: Take 1 capsule twice daily x 5 days; 2.  Cough, unspecified type-Rx prednisone  20 mg tablet: Take 3 tablets p.o. daily x 5 days, Rx'd Promethazine DM 6.25-15 mg / 5 mL syrup: Take 5 mL twice daily, as needed for cough. Advised patient take medications as directed with food to completion.  Advised take prednisone  with first dose of Tamiflu  for the next 5 days.  Advised may use Promethazine DM cough syrup at night for cough prior to sleep.  Patient has been advised of sedative effects of this medication.  Encouraged to increase daily water intake to 64 ounces per day while taking these medications.  Advised if symptoms worsen and/or unresolved please follow-up with your PCP or here for further evaluation.  Patient discharged home, hemodynamically stable. Final Clinical Impressions(s) / UC Diagnoses   Final diagnoses:  Cough, unspecified type  Influenza A     Discharge Instructions      Advised patient take medications as directed with food to completion.  Advised take prednisone  with first dose of Tamiflu  for the next 5 days.  Advised may use Promethazine DM cough syrup at night for cough prior to sleep.  Patient has been advised of sedative effects of this medication.  Encouraged to increase daily water intake to 64 ounces per day while taking these medications.  Advised if symptoms worsen and/or unresolved please follow-up with your PCP or here for further evaluation.     ED Prescriptions     Medication Sig Dispense Auth.  Provider   oseltamivir  (TAMIFLU ) 75 MG capsule Take 1 capsule (75 mg total) by mouth every 12 (twelve) hours. 10 capsule Khloe Hunkele, FNP   predniSONE  (DELTASONE ) 20 MG tablet Take 3 tabs PO daily x 5 days. 15 tablet Cloria Ciresi, FNP   promethazine-dextromethorphan (PROMETHAZINE-DM) 6.25-15 MG/5ML syrup Take 5 mLs by mouth 2 (two) times daily as needed for cough. 118 mL Parmvir Boomer, FNP      PDMP not reviewed this encounter.    [1]  Social History Tobacco Use   Smoking status: Former    Current packs/day: 0.00    Average packs/day: 2.0 packs/day for 55.0 years (110.0 ttl pk-yrs)    Types: Cigarettes    Start date: 11/08/1960    Quit date: 11/09/2015    Years since quitting: 8.7   Smokeless tobacco: Never  Vaping Use   Vaping status: Never Used  Substance Use Topics   Alcohol use: Not Currently    Comment: very seldom   Drug use: No     Teddy Sharper, FNP 08/10/24 1128  "

## 2024-11-19 ENCOUNTER — Ambulatory Visit
